# Patient Record
Sex: Female | Born: 1940 | Race: White | Hispanic: No | Marital: Married | Smoking: Former smoker
Health system: Southern US, Community
[De-identification: ages and names within clinical notes are randomized; demographics above are authoritative.]

## PROBLEM LIST (undated history)

## (undated) DIAGNOSIS — F32A Depression, unspecified: Secondary | ICD-10-CM

## (undated) DIAGNOSIS — M436 Torticollis: Secondary | ICD-10-CM

## (undated) DIAGNOSIS — J309 Allergic rhinitis, unspecified: Secondary | ICD-10-CM

## (undated) DIAGNOSIS — E559 Vitamin D deficiency, unspecified: Secondary | ICD-10-CM

## (undated) DIAGNOSIS — E039 Hypothyroidism, unspecified: Secondary | ICD-10-CM

## (undated) DIAGNOSIS — G8929 Other chronic pain: Secondary | ICD-10-CM

## (undated) DIAGNOSIS — K219 Gastro-esophageal reflux disease without esophagitis: Secondary | ICD-10-CM

## (undated) DIAGNOSIS — F329 Major depressive disorder, single episode, unspecified: Secondary | ICD-10-CM

## (undated) HISTORY — DX: Major depressive disorder, single episode, unspecified: F32.9

## (undated) HISTORY — DX: Gastro-esophageal reflux disease without esophagitis: K21.9

## (undated) HISTORY — DX: Other chronic pain: G89.29

## (undated) HISTORY — DX: Hypothyroidism, unspecified: E03.9

## (undated) HISTORY — DX: Depression, unspecified: F32.A

## (undated) HISTORY — PX: OTHER SURGICAL HISTORY: SHX169

## (undated) HISTORY — DX: Allergic rhinitis, unspecified: J30.9

## (undated) HISTORY — DX: Vitamin D deficiency, unspecified: E55.9

## (undated) HISTORY — DX: Torticollis: M43.6

## (undated) HISTORY — PX: LAPAROSCOPIC TUBAL LIGATION: SHX1937

## (undated) HISTORY — PX: SHOULDER OPEN ROTATOR CUFF REPAIR: SHX2407

## (undated) HISTORY — PX: BREAST ENHANCEMENT SURGERY: SHX7

## (undated) HISTORY — PX: NECK SURGERY: SHX720

## (undated) HISTORY — PX: AUGMENTATION MAMMAPLASTY: SUR837

---

## 1997-07-31 ENCOUNTER — Ambulatory Visit (HOSPITAL_COMMUNITY): Admission: RE | Admit: 1997-07-31 | Discharge: 1997-07-31 | Payer: Self-pay | Admitting: Obstetrics and Gynecology

## 1998-12-17 ENCOUNTER — Other Ambulatory Visit: Admission: RE | Admit: 1998-12-17 | Discharge: 1998-12-17 | Payer: Self-pay | Admitting: Obstetrics and Gynecology

## 1998-12-17 ENCOUNTER — Ambulatory Visit (HOSPITAL_COMMUNITY): Admission: RE | Admit: 1998-12-17 | Discharge: 1998-12-17 | Payer: Self-pay | Admitting: Obstetrics and Gynecology

## 1998-12-17 ENCOUNTER — Encounter: Payer: Self-pay | Admitting: Obstetrics and Gynecology

## 2000-07-08 ENCOUNTER — Encounter: Admission: RE | Admit: 2000-07-08 | Discharge: 2000-07-08 | Payer: Self-pay | Admitting: Internal Medicine

## 2000-07-08 ENCOUNTER — Encounter: Payer: Self-pay | Admitting: Internal Medicine

## 2000-07-12 ENCOUNTER — Encounter: Admission: RE | Admit: 2000-07-12 | Discharge: 2000-07-12 | Payer: Self-pay | Admitting: Internal Medicine

## 2000-07-12 ENCOUNTER — Encounter: Payer: Self-pay | Admitting: Internal Medicine

## 2000-08-24 ENCOUNTER — Encounter: Admission: RE | Admit: 2000-08-24 | Discharge: 2000-08-24 | Payer: Self-pay | Admitting: Obstetrics and Gynecology

## 2000-08-24 ENCOUNTER — Encounter: Payer: Self-pay | Admitting: Obstetrics and Gynecology

## 2000-08-25 ENCOUNTER — Encounter (INDEPENDENT_AMBULATORY_CARE_PROVIDER_SITE_OTHER): Payer: Self-pay

## 2000-08-25 ENCOUNTER — Other Ambulatory Visit: Admission: RE | Admit: 2000-08-25 | Discharge: 2000-08-25 | Payer: Self-pay | Admitting: Obstetrics and Gynecology

## 2002-01-03 ENCOUNTER — Ambulatory Visit (HOSPITAL_COMMUNITY): Admission: RE | Admit: 2002-01-03 | Discharge: 2002-01-03 | Payer: Self-pay | Admitting: Gastroenterology

## 2002-01-25 ENCOUNTER — Encounter: Payer: Self-pay | Admitting: Obstetrics and Gynecology

## 2002-01-25 ENCOUNTER — Ambulatory Visit (HOSPITAL_COMMUNITY): Admission: RE | Admit: 2002-01-25 | Discharge: 2002-01-25 | Payer: Self-pay | Admitting: Obstetrics and Gynecology

## 2002-10-25 ENCOUNTER — Encounter: Admission: RE | Admit: 2002-10-25 | Discharge: 2002-10-25 | Payer: Self-pay | Admitting: Internal Medicine

## 2002-10-25 ENCOUNTER — Encounter: Payer: Self-pay | Admitting: Internal Medicine

## 2003-04-24 ENCOUNTER — Encounter: Admission: RE | Admit: 2003-04-24 | Discharge: 2003-04-24 | Payer: Self-pay | Admitting: Internal Medicine

## 2003-11-05 ENCOUNTER — Other Ambulatory Visit: Admission: RE | Admit: 2003-11-05 | Discharge: 2003-11-05 | Payer: Self-pay | Admitting: Obstetrics and Gynecology

## 2003-11-21 ENCOUNTER — Encounter: Admission: RE | Admit: 2003-11-21 | Discharge: 2003-11-21 | Payer: Self-pay | Admitting: Internal Medicine

## 2003-12-04 ENCOUNTER — Encounter: Admission: RE | Admit: 2003-12-04 | Discharge: 2003-12-04 | Payer: Self-pay | Admitting: Internal Medicine

## 2004-05-02 ENCOUNTER — Encounter: Admission: RE | Admit: 2004-05-02 | Discharge: 2004-05-02 | Payer: Self-pay | Admitting: Internal Medicine

## 2004-12-01 ENCOUNTER — Other Ambulatory Visit: Admission: RE | Admit: 2004-12-01 | Discharge: 2004-12-01 | Payer: Self-pay | Admitting: Obstetrics and Gynecology

## 2004-12-23 ENCOUNTER — Encounter: Admission: RE | Admit: 2004-12-23 | Discharge: 2004-12-23 | Payer: Self-pay | Admitting: Obstetrics and Gynecology

## 2005-10-13 ENCOUNTER — Ambulatory Visit: Payer: Self-pay | Admitting: Anesthesiology

## 2005-11-08 ENCOUNTER — Encounter: Admission: RE | Admit: 2005-11-08 | Discharge: 2005-11-08 | Payer: Self-pay | Admitting: Internal Medicine

## 2005-11-10 ENCOUNTER — Encounter: Admission: RE | Admit: 2005-11-10 | Discharge: 2005-11-10 | Payer: Self-pay | Admitting: Internal Medicine

## 2005-12-03 ENCOUNTER — Other Ambulatory Visit: Admission: RE | Admit: 2005-12-03 | Discharge: 2005-12-03 | Payer: Self-pay | Admitting: Obstetrics and Gynecology

## 2005-12-06 ENCOUNTER — Ambulatory Visit: Payer: Self-pay | Admitting: Anesthesiology

## 2005-12-13 ENCOUNTER — Encounter: Admission: RE | Admit: 2005-12-13 | Discharge: 2005-12-13 | Payer: Self-pay | Admitting: Internal Medicine

## 2006-01-12 ENCOUNTER — Ambulatory Visit: Payer: Self-pay | Admitting: Anesthesiology

## 2006-02-21 ENCOUNTER — Encounter: Admission: RE | Admit: 2006-02-21 | Discharge: 2006-02-21 | Payer: Self-pay | Admitting: Internal Medicine

## 2006-05-10 ENCOUNTER — Encounter: Admission: RE | Admit: 2006-05-10 | Discharge: 2006-05-10 | Payer: Self-pay | Admitting: Internal Medicine

## 2006-09-14 ENCOUNTER — Encounter: Admission: RE | Admit: 2006-09-14 | Discharge: 2006-09-14 | Payer: Self-pay | Admitting: Obstetrics and Gynecology

## 2006-12-05 ENCOUNTER — Other Ambulatory Visit: Admission: RE | Admit: 2006-12-05 | Discharge: 2006-12-05 | Payer: Self-pay | Admitting: Obstetrics and Gynecology

## 2006-12-23 ENCOUNTER — Ambulatory Visit: Payer: Self-pay | Admitting: Neurosurgery

## 2007-01-13 ENCOUNTER — Ambulatory Visit: Payer: Self-pay | Admitting: Neurosurgery

## 2007-11-02 ENCOUNTER — Encounter: Admission: RE | Admit: 2007-11-02 | Discharge: 2007-11-02 | Payer: Self-pay | Admitting: Obstetrics and Gynecology

## 2007-12-12 ENCOUNTER — Other Ambulatory Visit: Admission: RE | Admit: 2007-12-12 | Discharge: 2007-12-12 | Payer: Self-pay | Admitting: Obstetrics and Gynecology

## 2007-12-18 ENCOUNTER — Encounter: Admission: RE | Admit: 2007-12-18 | Discharge: 2007-12-18 | Payer: Self-pay | Admitting: Obstetrics and Gynecology

## 2008-01-26 IMAGING — US US ABDOMEN COMPLETE
1 series · 14 of 25 positions shown · non-contrast
Comparison: 12/04/03.

CLINICAL DATA: Right upper quadrant pain.  
 COMPLETE ABDOMINAL ULTRASOUND:
TECHNIQUE: Complete abdominal ultrasound examination was performed including evaluation of the liver, gallbladder, bile ducts, pancreas, kidneys, spleen, IVC, and abdominal aorta.

[Series 1: unknown · 0.35mm/px · 14 of 93 slices shown]
[im 1/93]
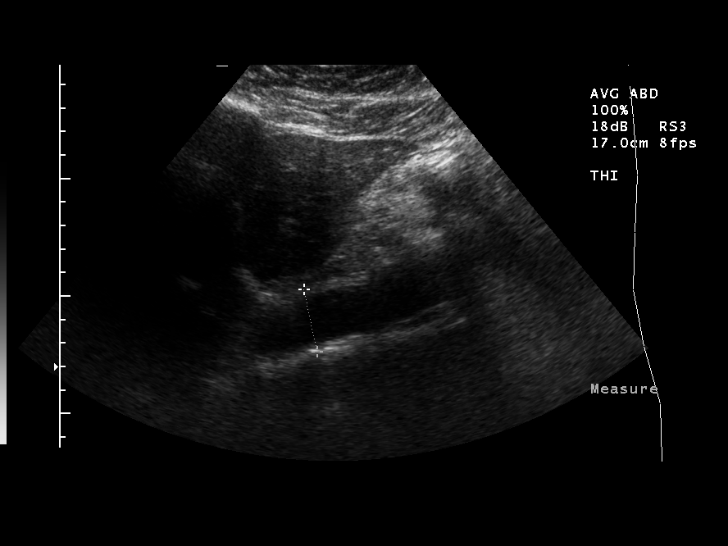
[im 8/93]
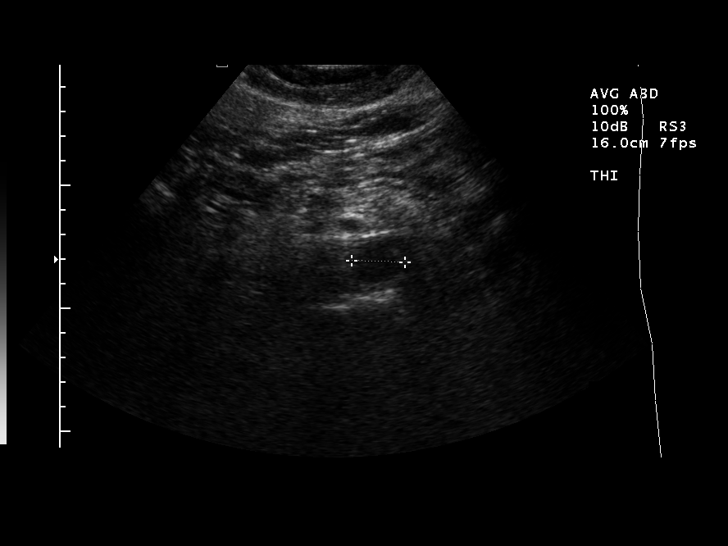
[im 16/93]
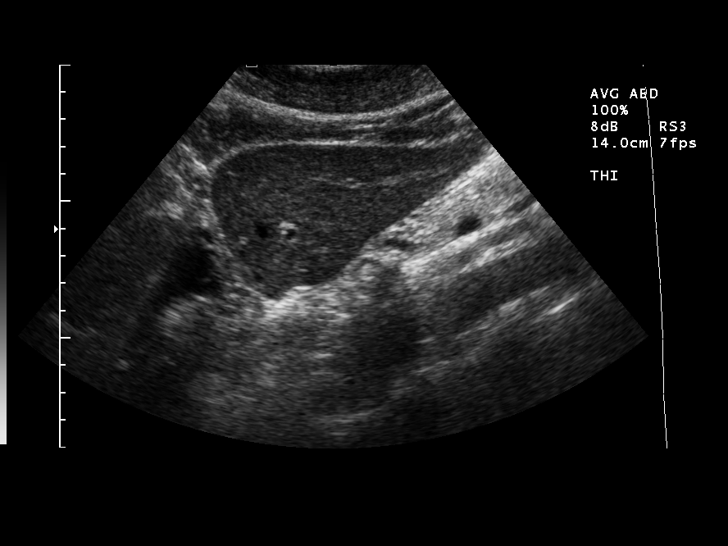
[im 24/93]
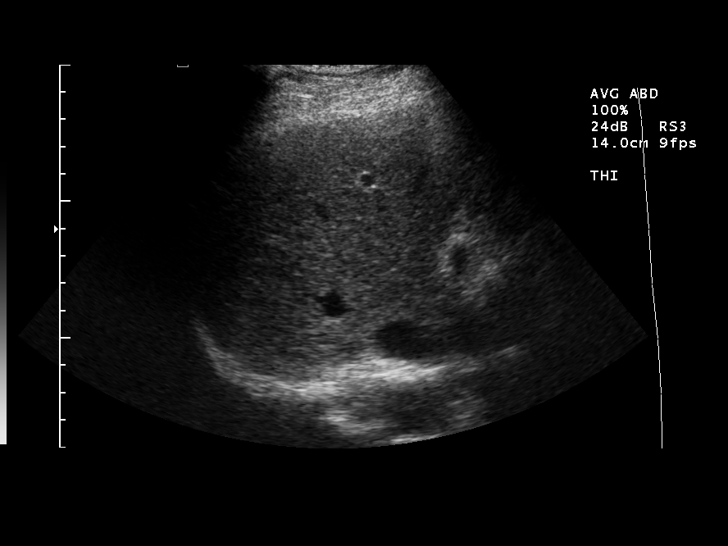
[im 31/93]
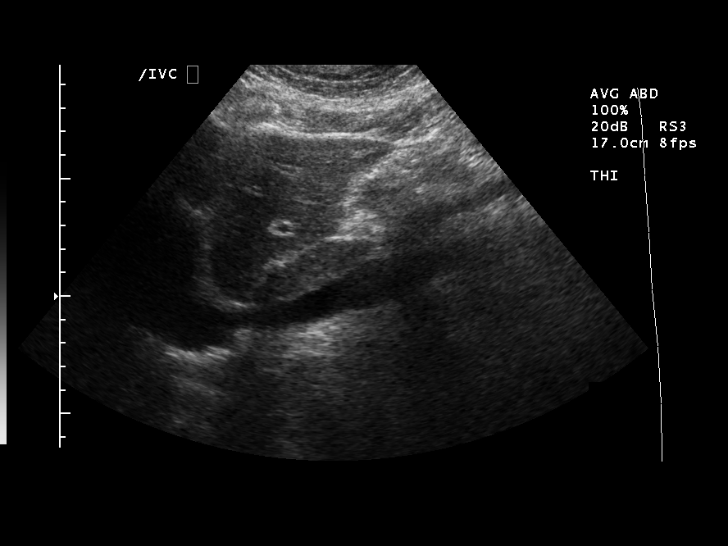
[im 35/93]
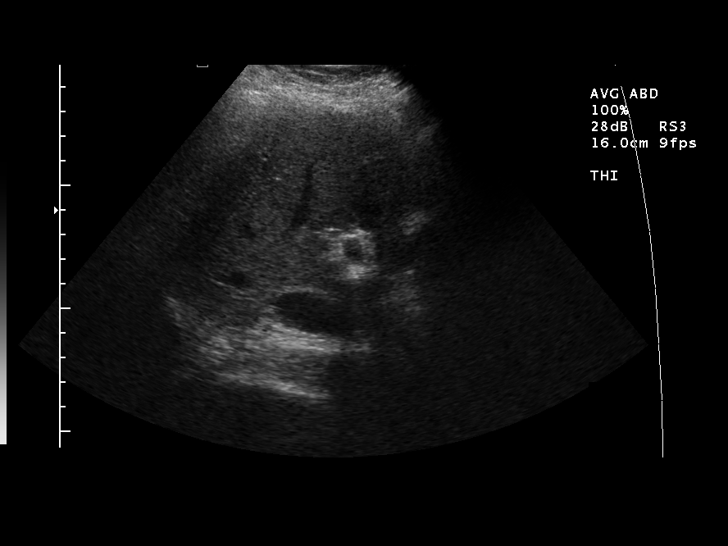
[im 43/93]
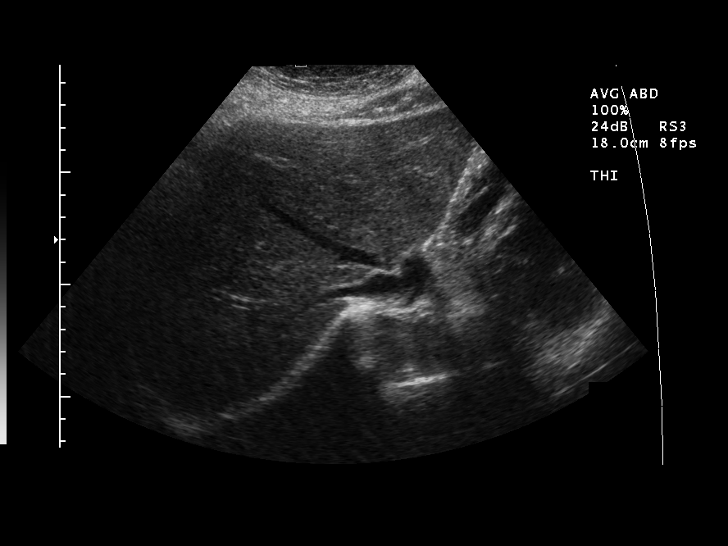
[im 50/93]
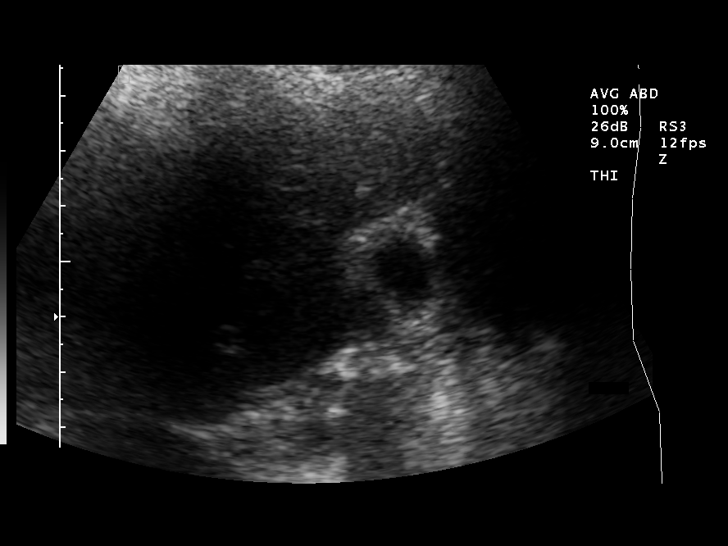
[im 58/93]
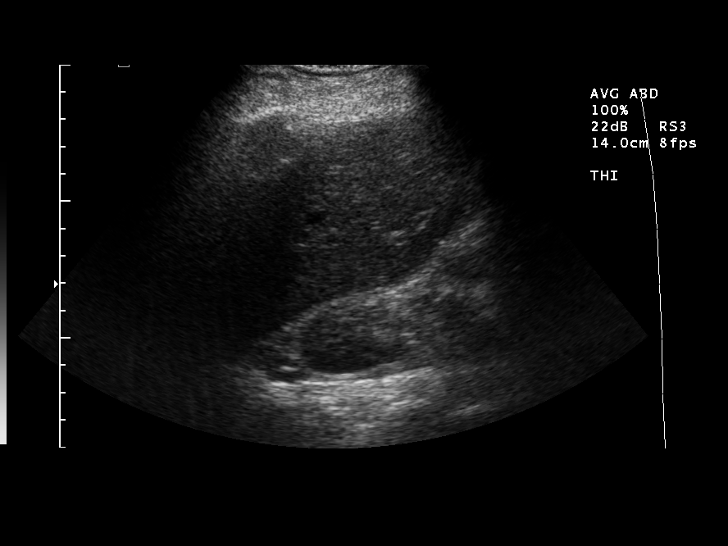
[im 62/93]
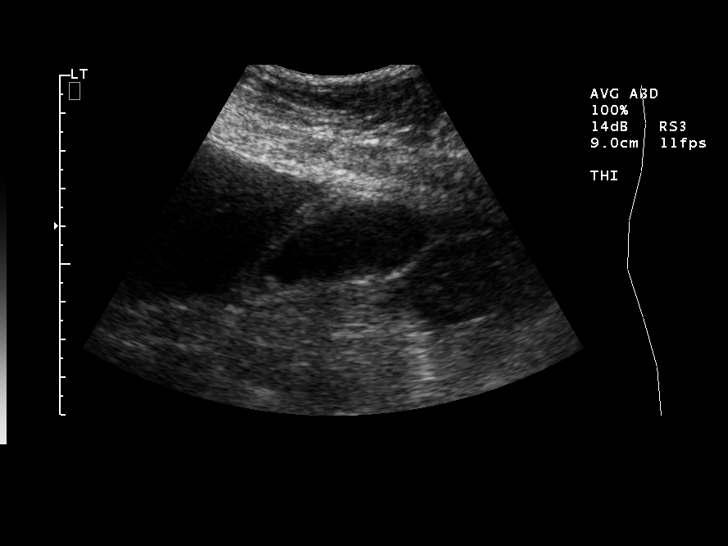
[im 70/93]
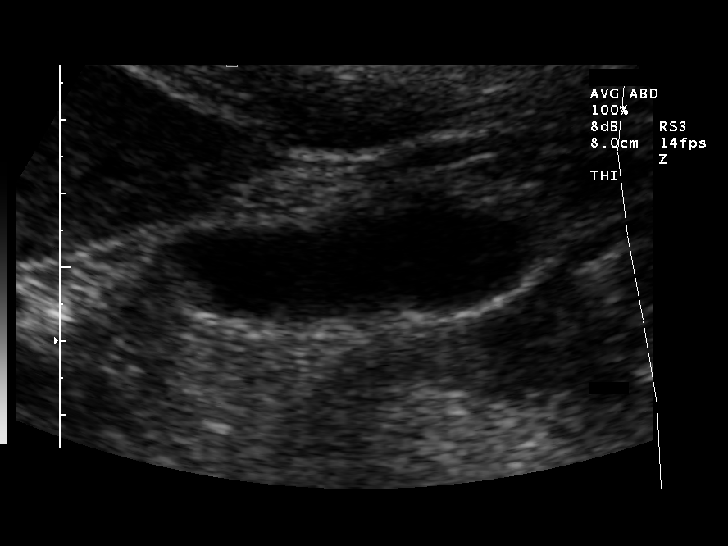
[im 77/93]
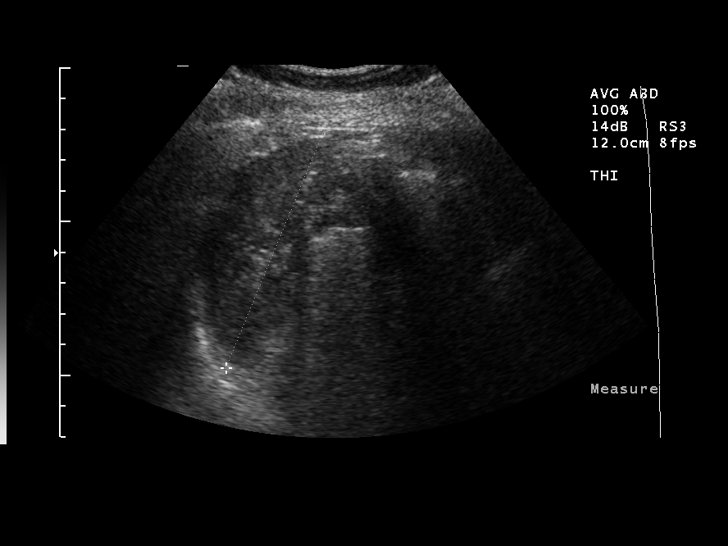
[im 85/93]
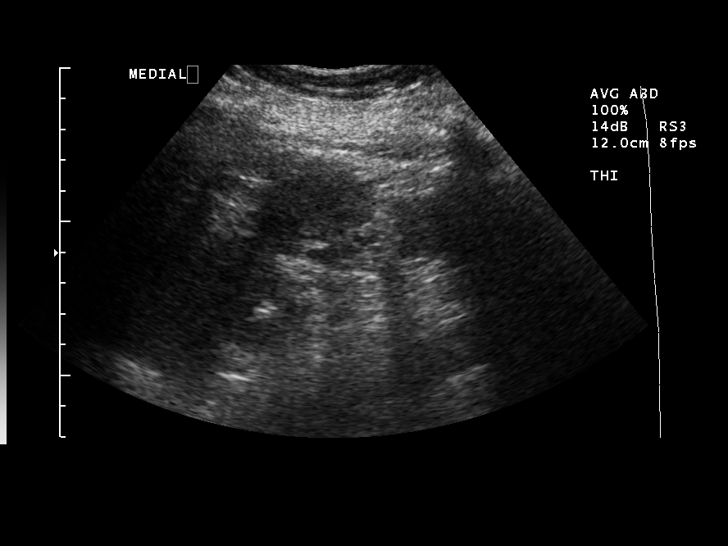
[im 93/93]
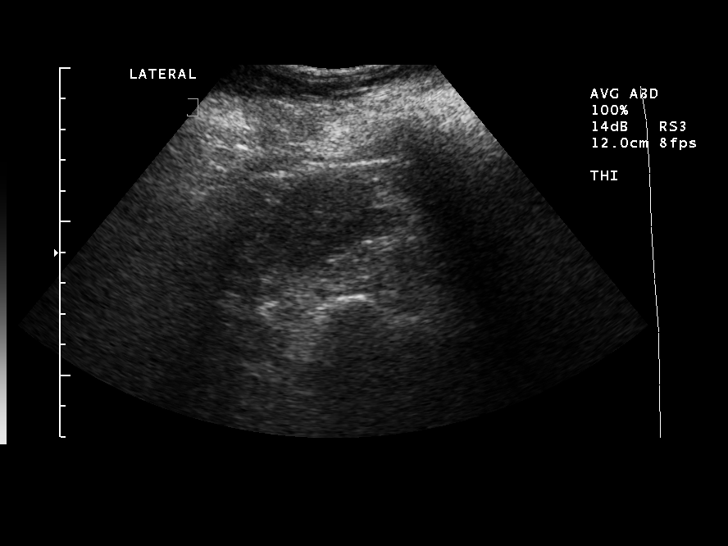

[14 of 25 positions shown; findings below may reference images not displayed]

FINDINGS: The aorta tapers normally.  The liver is unremarkable.  No extrahepatic biliary duct dilatation.  Gallbladder wall is slightly thickened and irregular measuring 5 to 8mm.  No definitive sonographic Murphy?s sign although patient reports tenderness during scanning.  IVC, pancreas, spleen, and kidneys are unremarkable.
IMPRESSION: Gallbladder wall thickening without internal debris or stones.  Please correlate clinically.

## 2009-01-23 ENCOUNTER — Other Ambulatory Visit: Admission: RE | Admit: 2009-01-23 | Discharge: 2009-01-23 | Payer: Self-pay | Admitting: Obstetrics and Gynecology

## 2009-02-17 ENCOUNTER — Ambulatory Visit: Payer: Self-pay | Admitting: Internal Medicine

## 2009-03-11 IMAGING — CR DG CHEST 2V
1 series · 2 of 2 positions shown · non-contrast
Comparison: none

REASON FOR EXAM: [DATE] Rib Pain
COMMENTS:

[Series 1: view not recorded · 0.17mm/px · 2 of 2 slices shown]
[im 1/2]
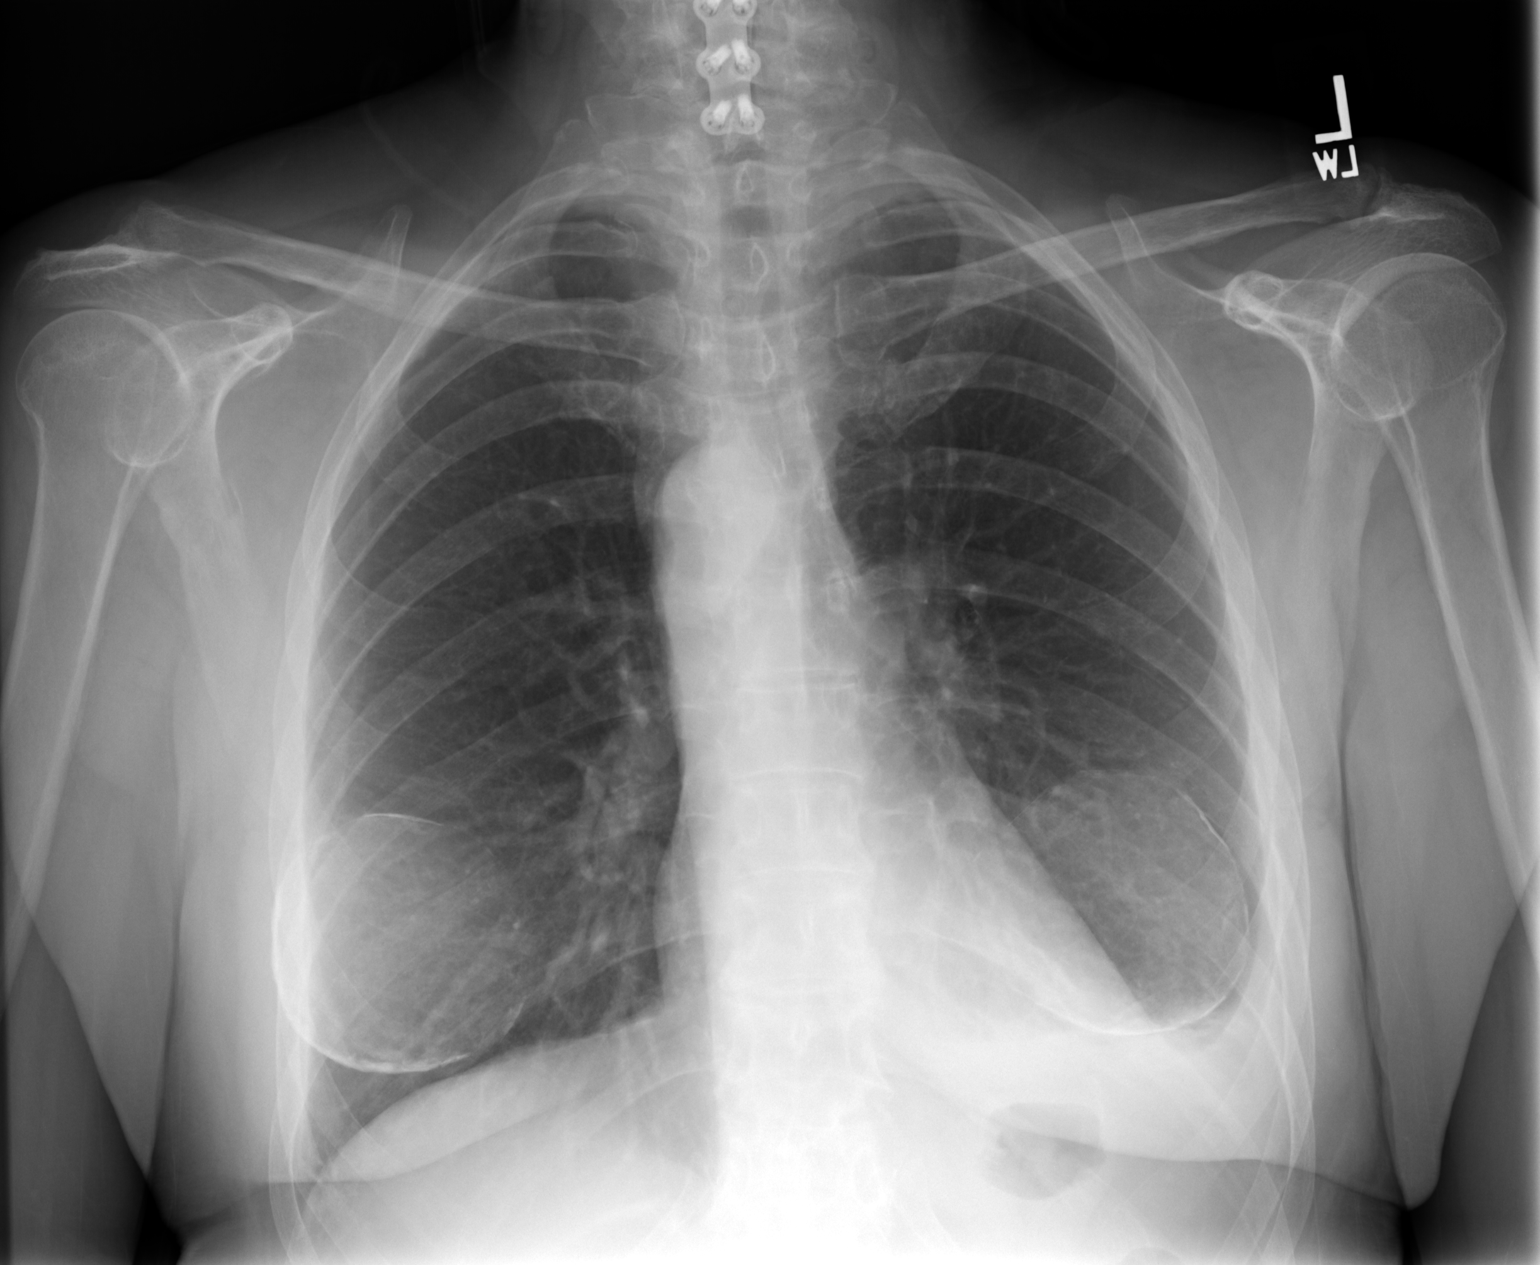
[im 2/2]
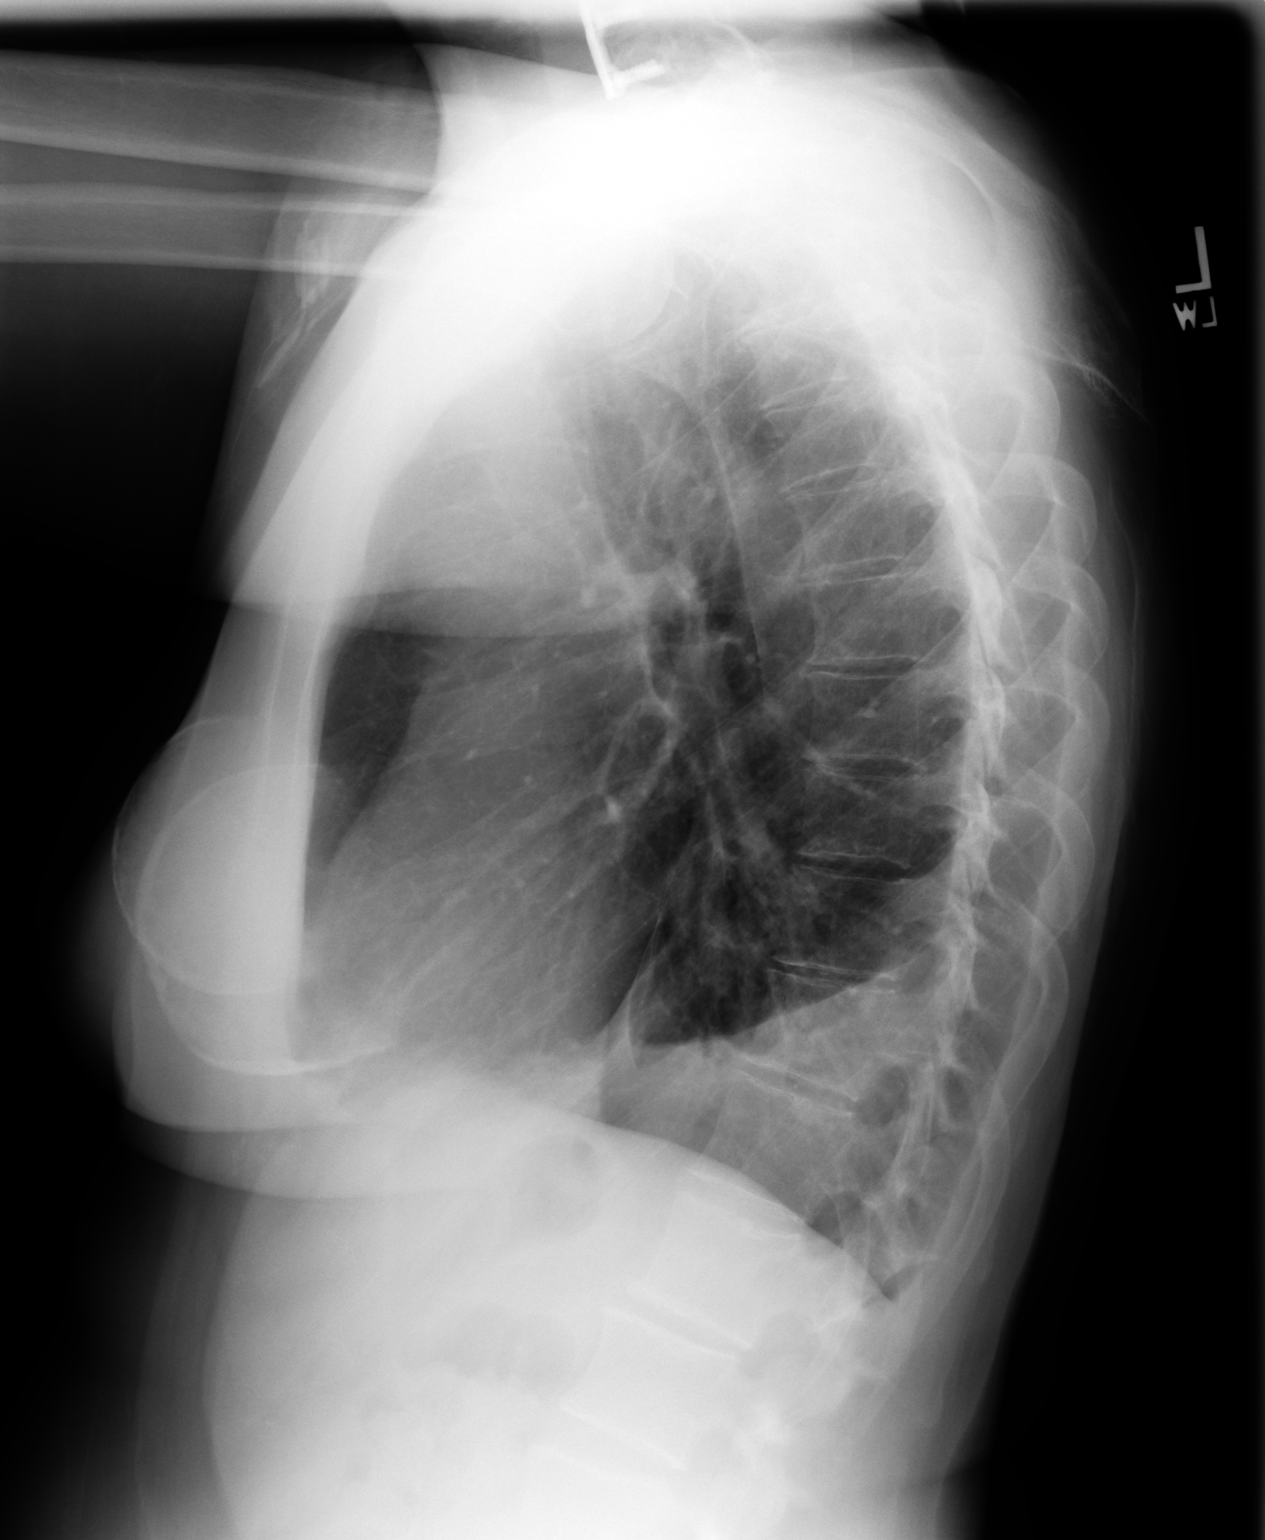

[2 of 2 positions shown; findings below may reference images not displayed]

PROCEDURE:     DXR - DXR CHEST PA (OR AP) AND LATERAL  - December 23, 2006 [DATE]

RESULT:     There is blunting of the LEFT costophrenic angle compatible with
a LEFT pleural effusion. The etiology for this effusion is not identified.
No pneumonia, pneumothorax or pleural effusion is seen. Heart size is
normal. The patient has a RIGHT-sided aortic knob. Postoperative changes of
the lower cervical spine are observed.
IMPRESSION: 1. There is a small LEFT pleural effusion.
2. The lung fields otherwise are clear.
3. There is a RIGHT-sided aortic knob.
4. The chest appears hyperexpanded suspicious for a history of COPD or
asthma.

## 2009-03-13 ENCOUNTER — Emergency Department: Payer: Self-pay | Admitting: Emergency Medicine

## 2009-03-17 ENCOUNTER — Ambulatory Visit: Payer: Self-pay | Admitting: Internal Medicine

## 2009-04-19 ENCOUNTER — Ambulatory Visit: Payer: Self-pay | Admitting: Internal Medicine

## 2009-04-22 ENCOUNTER — Ambulatory Visit: Payer: Self-pay | Admitting: Internal Medicine

## 2009-05-20 ENCOUNTER — Ambulatory Visit: Payer: Self-pay | Admitting: Internal Medicine

## 2009-05-28 ENCOUNTER — Encounter: Admission: RE | Admit: 2009-05-28 | Discharge: 2009-05-28 | Payer: Self-pay | Admitting: Obstetrics and Gynecology

## 2009-06-17 ENCOUNTER — Ambulatory Visit: Payer: Self-pay | Admitting: Internal Medicine

## 2009-07-18 ENCOUNTER — Ambulatory Visit: Payer: Self-pay | Admitting: Internal Medicine

## 2009-07-21 ENCOUNTER — Ambulatory Visit: Payer: Self-pay | Admitting: Internal Medicine

## 2009-07-28 ENCOUNTER — Ambulatory Visit: Payer: Self-pay | Admitting: Internal Medicine

## 2009-08-17 ENCOUNTER — Ambulatory Visit: Payer: Self-pay | Admitting: Internal Medicine

## 2009-11-17 ENCOUNTER — Ambulatory Visit: Payer: Self-pay | Admitting: Internal Medicine

## 2009-12-11 ENCOUNTER — Ambulatory Visit: Payer: Self-pay | Admitting: Internal Medicine

## 2009-12-18 ENCOUNTER — Ambulatory Visit: Payer: Self-pay | Admitting: Internal Medicine

## 2010-02-19 ENCOUNTER — Ambulatory Visit: Payer: Self-pay | Admitting: Internal Medicine

## 2010-03-19 ENCOUNTER — Ambulatory Visit: Payer: Self-pay | Admitting: Internal Medicine

## 2010-05-10 ENCOUNTER — Encounter: Payer: Self-pay | Admitting: Orthopaedic Surgery

## 2010-08-26 ENCOUNTER — Ambulatory Visit: Payer: Self-pay | Admitting: Internal Medicine

## 2010-09-18 ENCOUNTER — Ambulatory Visit: Payer: Self-pay | Admitting: Internal Medicine

## 2011-02-01 ENCOUNTER — Other Ambulatory Visit: Payer: Self-pay | Admitting: Obstetrics and Gynecology

## 2011-02-01 ENCOUNTER — Other Ambulatory Visit (HOSPITAL_COMMUNITY)
Admission: RE | Admit: 2011-02-01 | Discharge: 2011-02-01 | Disposition: A | Discharge status: Home / Self Care | Payer: Medicare Other | Source: Ambulatory Visit | Attending: Obstetrics and Gynecology | Admitting: Obstetrics and Gynecology

## 2011-02-01 DIAGNOSIS — Z124 Encounter for screening for malignant neoplasm of cervix: Secondary | ICD-10-CM | POA: Insufficient documentation

## 2011-02-01 DIAGNOSIS — Z113 Encounter for screening for infections with a predominantly sexual mode of transmission: Secondary | ICD-10-CM | POA: Insufficient documentation

## 2011-02-01 DIAGNOSIS — Z1159 Encounter for screening for other viral diseases: Secondary | ICD-10-CM | POA: Insufficient documentation

## 2011-02-02 ENCOUNTER — Other Ambulatory Visit: Payer: Self-pay | Admitting: Plastic Surgery

## 2011-02-02 DIAGNOSIS — Z1231 Encounter for screening mammogram for malignant neoplasm of breast: Secondary | ICD-10-CM

## 2011-02-19 ENCOUNTER — Ambulatory Visit
Admission: RE | Admit: 2011-02-19 | Discharge: 2011-02-19 | Disposition: A | Discharge status: Home / Self Care | Payer: Medicare Other | Source: Ambulatory Visit | Attending: Plastic Surgery | Admitting: Plastic Surgery

## 2011-02-19 DIAGNOSIS — Z1231 Encounter for screening mammogram for malignant neoplasm of breast: Secondary | ICD-10-CM

## 2011-02-22 ENCOUNTER — Other Ambulatory Visit: Payer: Self-pay | Admitting: Obstetrics and Gynecology

## 2011-02-22 DIAGNOSIS — R1031 Right lower quadrant pain: Secondary | ICD-10-CM

## 2011-02-26 ENCOUNTER — Other Ambulatory Visit: Payer: Medicare Other

## 2011-03-01 ENCOUNTER — Ambulatory Visit: Payer: Self-pay | Admitting: Internal Medicine

## 2011-03-02 ENCOUNTER — Ambulatory Visit
Admission: RE | Admit: 2011-03-02 | Discharge: 2011-03-02 | Disposition: A | Discharge status: Home / Self Care | Payer: Medicare Other | Source: Ambulatory Visit | Attending: Obstetrics and Gynecology | Admitting: Obstetrics and Gynecology

## 2011-03-02 DIAGNOSIS — R1031 Right lower quadrant pain: Secondary | ICD-10-CM

## 2011-03-02 MED ORDER — IOHEXOL 300 MG/ML  SOLN
100.0000 mL | Freq: Once | INTRAMUSCULAR | Status: AC | PRN
  Administered 2011-03-02: 100 mL via INTRAVENOUS

## 2011-03-20 ENCOUNTER — Ambulatory Visit: Payer: Self-pay | Admitting: Internal Medicine

## 2011-04-20 ENCOUNTER — Ambulatory Visit: Payer: Self-pay | Admitting: Internal Medicine

## 2011-05-21 ENCOUNTER — Ambulatory Visit: Payer: Self-pay | Admitting: Internal Medicine

## 2011-09-23 ENCOUNTER — Other Ambulatory Visit: Payer: Self-pay | Admitting: Plastic Surgery

## 2012-03-08 ENCOUNTER — Other Ambulatory Visit: Payer: Self-pay | Admitting: Internal Medicine

## 2012-03-10 ENCOUNTER — Other Ambulatory Visit: Payer: Self-pay | Admitting: Plastic Surgery

## 2012-03-10 DIAGNOSIS — N644 Mastodynia: Secondary | ICD-10-CM

## 2012-03-20 ENCOUNTER — Other Ambulatory Visit: Payer: Self-pay | Admitting: Plastic Surgery

## 2012-03-20 ENCOUNTER — Ambulatory Visit
Admission: RE | Admit: 2012-03-20 | Discharge: 2012-03-20 | Disposition: A | Discharge status: Home / Self Care | Payer: Medicare Other | Source: Ambulatory Visit | Attending: Plastic Surgery | Admitting: Plastic Surgery

## 2012-03-20 DIAGNOSIS — N644 Mastodynia: Secondary | ICD-10-CM

## 2013-01-05 ENCOUNTER — Ambulatory Visit: Payer: Self-pay | Admitting: Otolaryngology

## 2013-02-22 ENCOUNTER — Other Ambulatory Visit: Payer: Self-pay

## 2013-02-22 DIAGNOSIS — Z1231 Encounter for screening mammogram for malignant neoplasm of breast: Secondary | ICD-10-CM

## 2013-03-26 ENCOUNTER — Ambulatory Visit
Admission: RE | Admit: 2013-03-26 | Discharge: 2013-03-26 | Disposition: A | Discharge status: Home / Self Care | Payer: Medicare Other | Source: Ambulatory Visit

## 2013-03-26 DIAGNOSIS — Z1231 Encounter for screening mammogram for malignant neoplasm of breast: Secondary | ICD-10-CM

## 2013-04-25 ENCOUNTER — Encounter (INDEPENDENT_AMBULATORY_CARE_PROVIDER_SITE_OTHER): Payer: Self-pay

## 2013-04-25 ENCOUNTER — Encounter: Payer: Self-pay | Admitting: Neurology

## 2013-04-25 ENCOUNTER — Ambulatory Visit (INDEPENDENT_AMBULATORY_CARE_PROVIDER_SITE_OTHER): Payer: Medicare Other | Admitting: Neurology

## 2013-04-25 VITALS — BP 135/83 | HR 96 | Ht 69.0 in | Wt 190.0 lb

## 2013-04-25 DIAGNOSIS — M436 Torticollis: Secondary | ICD-10-CM

## 2013-04-25 DIAGNOSIS — G8929 Other chronic pain: Secondary | ICD-10-CM | POA: Insufficient documentation

## 2013-04-25 DIAGNOSIS — K219 Gastro-esophageal reflux disease without esophagitis: Secondary | ICD-10-CM

## 2013-04-25 DIAGNOSIS — F329 Major depressive disorder, single episode, unspecified: Secondary | ICD-10-CM | POA: Insufficient documentation

## 2013-04-25 DIAGNOSIS — J309 Allergic rhinitis, unspecified: Secondary | ICD-10-CM

## 2013-04-25 DIAGNOSIS — E039 Hypothyroidism, unspecified: Secondary | ICD-10-CM

## 2013-04-25 DIAGNOSIS — E559 Vitamin D deficiency, unspecified: Secondary | ICD-10-CM | POA: Insufficient documentation

## 2013-04-25 DIAGNOSIS — F32A Depression, unspecified: Secondary | ICD-10-CM | POA: Insufficient documentation

## 2013-04-25 NOTE — Progress Notes (Signed)
[facility]  PATIENT: Julie Shelton DOB: 11/21/1940  HISTORICAL  Julie Shelton is a 73 years old right-handed Caucasian female, by herself, referred by her primary care physician Dr. 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  She had a past medical history of hypothyroidism, on supplement she was diagnosed with cervical dystonia around 2008 at [facility]  Around 2004, wiithout clear trigger, she had gradual onset of head pulling to her left shoulder, difficulty turning her neck to either direction especially to her left, later she also developed shooting pain from her left cervical region down to her left shoulder, numbness involving left fifth finger  She had MRI of cervical in 2006 at [facility] imaging, which showed loss of height with left eccentric disc osteophyte complex at C5-6, and C6 and 7, with mild central canal narrowing, minimum left foraminal narrowing at C6 and 7, repeat MRI of cervical in 2007, a year later, continued to demonstrate spondylosis at C5-6, C6 and 7, with endplate osteophyte, covering bulging disc material, mild canal stenosis, no cord compression, there is no significant foraminal stenosis.  However with her persistent left-sided neck pain, shooting pain to her left shoulder, she underwent anterior cervical discectomy, and fusion at C5-6, C6 and 7 with tricortical autograft, and plating with a short statue synthetic system C5-C7 by Dr.Roy, Ranjan at [facility] in August 2008, this was done at [place], [place],  She only has minimum improvement of her neck pain postsurgically, eventually she was evaluated by [facility] in October 2008, was correctly diagnosed with spasmodic torticollis, has received repeat EMG guided Botox injection under supervision of Dr. Jeremy Johann, she was receiving injections every 3-5 months, last injection was July 19 2012, she reported mild to moderate improvement with each injection, due to insurance  change, she could no longer go to [facility] for continued treatment. She desires to transfer her injection locally.  She denied any significant side effect was Botox injection, was not sure about the dosage, but reported continued mild to moderate improvement with each injection, over the past few months, because prolonged interval with the last injection, she noticed worsening neck pain, difficulty turning her neck, and the worsening abnormal posture.  She is also taking clonazepam 0.5 mg twice a day, she has significant neck pain, 8 out of 10, which has put limitation on her daily activity,  She denies gait difficulty, no bilateral arm persistent sensory loss, all weakness,   REVIEW OF SYSTEMS: Full 14 system review of systems performed and notable only for neck pain, achy muscles, headache, slurred speech, mild difficulty swallowing due to abnormal neck posture, depression, disinterested in activities , ALLERGIES: No Known Allergies  HOME MEDICATIONS:  Clonazepam 0 point 5 mg twice a day Levothyroxine,  PAST MEDICAL HISTORY: Past Medical History  Diagnosis Date  . Torticollis   . Depression   . GERD (gastroesophageal reflux disease)   . Hypothyroidism   . Allergic rhinitis   . Vitamin D deficiency     PAST SURGICAL HISTORY: Past Surgical History  Procedure Laterality Date  . Shoulder open rotator cuff repair left 2003  . Breast enhancement surgery  1970s.  . Laparoscopic tubal ligation    . Breast implants removed  2014  . Neck surgery,  Left cervical radiculopathy Dr. Channing Mutters  2008    FAMILY HISTORY: Family History  Problem Relation Age of Onset  . Dementia Father     SOCIAL HISTORY:  History   Social History  . Marital  Status: Married    Spouse Name: Julie Shelton    Number of Children: 2  . Years of Education: 12   Occupational History    retired   Social History Main Topics  . Smoking status: Former Smoker    Types: Cigarettes  . Smokeless tobacco: Never Used      Comment: Quit in 1972  . Alcohol Use: 1.8 oz/week    3 Cans of beer per week     Comment: three at night  . Drug Use: No  . Sexual Activity: Not on file   Other Topics Concern  . Not on file   Social History Narrative   Patient lives at home with her husband Julie Russell([name]) .   Patient is retired.    Education- High school    Caffeine- two or three cups daily.     PHYSICAL EXAM   Filed Vitals:   04/25/13 1251  BP: 135/83  Pulse: 96  Height: 5\' 9"  (1.753 m)  Weight: 190 lb (86.183 kg)     Body mass index is 28.05 kg/(m^2).   Generalized: In no acute distress  Neck: Supple, no carotid bruits   Cardiac: Regular rate rhythm  Pulmonary: Clear to auscultation bilaterally  Musculoskeletal: No deformity  Neurological examination  Mentation: Alert oriented to time, place, history taking, and causual conversation, she has constant moderate retrocollis, moderate left turning, mild right tilt, mild to moderate right shoulder elevation,  Cranial nerve II-XII: Pupils were equal round reactive to light extraocular movements were full, Visual field were full on confrontational test. Bilateral fundi were sharp.  Facial sensation and strength were normal. Hearing was intact to finger rubbing bilaterally. Uvula tongue midline.  head turning and shoulder shrug and were normal and symmetric.Tongue protrusion into cheek strength was normal.  Motor: normal tone, bulk and strength.  Sensory: Intact to fine touch, pinprick, preserved vibratory sensation, and proprioception at toes.  Coordination: Normal finger to nose, heel-to-shin bilaterally there was no truncal ataxia  Gait: Rising up from seated position without assistance, normal stance, without trunk ataxia, moderate stride, good arm swing, smooth turning, able to perform tiptoe, and heel walking without difficulty.   Romberg signs: Negative  Deep tendon reflexes: Brachioradialis 2/2, biceps 2/2, triceps 2/2, patellar 2/2, Achilles  2/2, plantar responses were flexor bilaterally.   DIAGNOSTIC DATA (LABS, IMAGING, TESTING) - I reviewed patient records, labs, notes, testing and imaging myself where available.  ASSESSMENT AND PLAN   73 years old Caucasian female, with a long-standing history of cervical dystonia, responding mild to moderately to previous Botox A injection,   1. Continue botulinum toxin injection, will try xeomin. 2. RTC in one for EMG guided injection  Levert FeinsteinYijun Danyel Griess, M.D. Ph.D.  [facility] [address], [address] [place], [place] [address] [phone]

## 2013-05-30 ENCOUNTER — Encounter (INDEPENDENT_AMBULATORY_CARE_PROVIDER_SITE_OTHER): Payer: Self-pay

## 2013-05-30 ENCOUNTER — Ambulatory Visit (INDEPENDENT_AMBULATORY_CARE_PROVIDER_SITE_OTHER): Payer: Medicare Other | Admitting: Neurology

## 2013-05-30 ENCOUNTER — Encounter: Payer: Self-pay | Admitting: Neurology

## 2013-05-30 VITALS — BP 135/75 | HR 83 | Ht 66.0 in | Wt 189.0 lb

## 2013-05-30 DIAGNOSIS — G8929 Other chronic pain: Secondary | ICD-10-CM

## 2013-05-30 DIAGNOSIS — G243 Spasmodic torticollis: Secondary | ICD-10-CM

## 2013-05-30 DIAGNOSIS — M436 Torticollis: Secondary | ICD-10-CM

## 2013-05-30 MED ORDER — INCOBOTULINUMTOXINA 100 UNITS IM SOLR
200.0000 [IU] | Freq: Once | INTRAMUSCULAR | Status: AC
  Administered 2013-05-30: 200 [IU] via INTRAMUSCULAR

## 2013-05-30 NOTE — Progress Notes (Signed)
[facility]  PATIENT: Frederick Peersdna R Isaza DOB: 1940/12/25  HISTORICAL  Marily Memosdna is a 73 years old right-handed Caucasian female, by herself, referred by her primary care physician Dr. 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  She had a past medical history of hypothyroidism, on supplement,  she was [facility] with [facility] around [facility]  Around 2004, wiithout clear trigger, she had gradual onset of head pulling to her left shoulder, difficulty turning her neck to either direction especially to her left, later she also developed shooting pain from her left cervical region down to her left shoulder, numbness involving left fifth finger  She [facility], which showed loss of height with left eccentric disc osteophyte complex at C5-6, and C6 and 7, with mild central canal narrowing, minimum left foraminal narrowing at C6 and 7, repeat MRI of cervical in 2007, a year later, continued to demonstrate spondylosis at C5-6, C6 and 7, with endplate osteophyte, covering bulging disc material, mild canal stenosis, no cord compression, there is no significant foraminal stenosis.  However with her persistent left-sided neck pain, shooting pain to her left shoulder, she underwent anterior cervical discectomy, and fusion at C5-6, C6 and 7 with tricortical autograft, and plating with a short statue synthetic system C5-C7 by Dr.Roy, Ranjan at [facility] in August 2008, this was done [place],  She only has minimum improvement of her neck pain postsurgically, eventually she was evaluated by [facility] in October 2008, was correctly diagnosed with spasmodic torticollis, has received repeat EMG guided Botox injection under supervision of Dr. Jeremy JohannJanice Massey, she was receiving injections every 3-5 months, last injection was April 2,2014, she reported mild to moderate improvement with each injection, due to  insurance change, she [facility] no [facility]. She desires to transfer her injection locally.  She denied any significant side effect was Botox injection, was not sure about the dosage, but reported continued mild to moderate improvement with each injection, over the past few months, because prolonged interval with the last injection, she noticed worsening neck pain, difficulty turning her neck, and the worsening abnormal posture.  She is also taking clonazepam 0.5 mg twice a day, she has significant neck pain, 8 out of 10, which has put limitation on her daily activity,  She denies gait difficulty, no bilateral arm persistent sensory loss, all weakness,   UPDATE Feb 11th 2015: She came in for EMG guided xeomin injections for cervical dystonia, this is the first time she used Xeomin she continued to complains of neck hyperextension, cracking sound when moving her neck, gait difficulty,    REVIEW OF SYSTEMS: Full 14 system review of systems performed and notable only for neck pain, achy muscles, headache, slurred speech, mild difficulty swallowing due to abnormal neck posture, depression, disinterested in activities , ALLERGIES: No Known Allergies  HOME MEDICATIONS:  Clonazepam 0 point 5 mg twice a day Levothyroxine,  PAST MEDICAL HISTORY: Past Medical History  Diagnosis Date  . Torticollis   . Depression   . GERD (gastroesophageal reflux disease)   . Hypothyroidism   . Allergic rhinitis   . Vitamin D deficiency     PAST SURGICAL HISTORY: Past Surgical History  Procedure Laterality Date  . Shoulder open rotator cuff repair left 2003  . Breast enhancement surgery  1970s.  . Laparoscopic tubal ligation    . Breast implants removed  2014  . Neck  surgery,  Left cervical radiculopathy Dr. Channing Mutters  2008    FAMILY HISTORY: Family History  Problem Relation Age of Onset  . Dementia Father   . Thyroid disease Mother     SOCIAL HISTORY:  History   Social  History  . Marital Status: Married    Spouse Name: Dorma Russell    Number of Children: 2  . Years of Education: 12   Occupational History    retired   Social History Main Topics  . Smoking status: Former Smoker    Types: Cigarettes  . Smokeless tobacco: Never Used     Comment: Quit in 1972  . Alcohol Use: 1.8 oz/week    3 Cans of beer per week     Comment: three at night  . Drug Use: No  . Sexual Activity: Not on file   Other Topics Concern  . Not on file   Social History Narrative   Patient lives at home with her husband Dorma Russell) .   Patient is retired.    Education- High school    Caffeine- two or three cups daily.     PHYSICAL EXAM   Filed Vitals:   05/30/13 1421  BP: 135/75  Pulse: 83  Height: 5\' 6"  (1.676 m)  Weight: 189 lb (85.73 kg)     Body mass index is 30.52 kg/(m^2).   Generalized: In no acute distress  Neck: Supple, no carotid bruits   Cardiac: Regular rate rhythm  Pulmonary: Clear to auscultation bilaterally  Musculoskeletal: No deformity  Neurological examination  Mentation: Alert oriented to time, place, history taking, and causual conversation, she has constant moderate to severe  retrocollis, moderate left turning, mild right tilt, mild to moderate right shoulder elevation,  Cranial nerve II-XII: Pupils were equal round reactive to light extraocular movements were full, Visual field were full on confrontational test. Bilateral fundi were sharp.  Facial sensation and strength were normal. Hearing was intact to finger rubbing bilaterally. Uvula tongue midline.  head turning and shoulder shrug and were normal and symmetric.Tongue protrusion into cheek strength was normal.  Motor: normal tone, bulk and strength.  Sensory: Intact to fine touch, pinprick, preserved vibratory sensation, and proprioception at toes.  Coordination: Normal finger to nose, heel-to-shin bilaterally there was no truncal ataxia  Gait: Rising up from seated position without  assistance, normal stance, without trunk ataxia, moderate stride, good arm swing, smooth turning, able to perform tiptoe, and heel walking without difficulty.   Romberg signs: Negative  Deep tendon reflexes: Brachioradialis 2/2, biceps 2/2, triceps 2/2, patellar 2/2, Achilles 2/2, plantar responses were flexor bilaterally.   DIAGNOSTIC DATA (LABS, IMAGING, TESTING) - I reviewed patient records, labs, notes, testing and imaging myself where available.  ASSESSMENT AND PLAN   73 years old Caucasian female, with a long-standing history of cervical dystonia, responding mild to moderately to previous Botox A injection,   EMG guided  xeomin injections  (100 units /2 cc normal saline , Lot November 4 8 9 4 7  8, Exp Jan 2017), 200 units was used.  Right oblique capitis inferior , 25 units Left oblique capitis inferior 25 units   Right longissimus capitis  25 units   Right semispinalis capitis 25 units Left semispinalis capitis 25 units  Right splenius capitis 25 units Left splenius capitis 25 units x2= 50 units  We will also proceed with MRI of cervical spine to rule out cervical radiculopathy, myelopathy, I will call her report, return to clinic in 3 months for repeat injection  Marcial Pacas, M.D. Ph.D.  [facility] [address] [place], [place] [address] [phone]

## 2013-05-31 DIAGNOSIS — M542 Cervicalgia: Secondary | ICD-10-CM | POA: Insufficient documentation

## 2013-06-01 ENCOUNTER — Telehealth: Payer: Self-pay | Admitting: Neurology

## 2013-06-01 NOTE — Telephone Encounter (Signed)
Patient is requesting meds for MRI scheduled on 02/25.  She would like something stronger prescribed than what she has previously been given, asking that it be called into [facility].  I called the patient to ask if she knew what was previously prescribed, and she said she does not know what med it was or where she got it filled.  The patient indicates she currently takes Klonopin 0.5mg  twice daily.  I will be happy to call in an Rx, with your approval.  Please advise.  Thank you.

## 2013-06-01 NOTE — Telephone Encounter (Signed)
Pt called and stated that she has her MRI scheduled for 2-25 @ 2:30 pm.  She is asking to be prescribed something to help her as she is severely claustrophobic.  She said that the last time it took 3 tries for her to be able to go into the machine. She said she may need something stronger than she received last time.  She asked if it would be possible for the medication to be went to the [facility] by tomorrow, but will understand if it isn't.  Please call if necessary.  Thank you

## 2013-06-04 MED ORDER — DIAZEPAM 5 MG PO TABS: 5.0000 mg | ORAL_TABLET | Freq: Four times a day (QID) | ORAL | Status: DC | PRN

## 2013-06-04 NOTE — Telephone Encounter (Signed)
I called the patient.  Relayed drug info and instructions.  Patient verbalized understanding.

## 2013-06-04 NOTE — Telephone Encounter (Signed)
Jessica: Please call patient I have refilled Valium 5 mg tablets, 5 tablets, one to 2 tablets 30 minutes before the MRI, may repeat twice. She does need people to drive her for MRI.

## 2013-06-13 ENCOUNTER — Ambulatory Visit
Admission: RE | Admit: 2013-06-13 | Discharge: 2013-06-13 | Disposition: A | Discharge status: Home / Self Care | Payer: Medicare Other | Source: Ambulatory Visit | Attending: Neurology | Admitting: Neurology

## 2013-06-13 DIAGNOSIS — M436 Torticollis: Secondary | ICD-10-CM

## 2013-06-13 DIAGNOSIS — G8929 Other chronic pain: Secondary | ICD-10-CM

## 2013-06-15 NOTE — Progress Notes (Signed)
Quick Note:  Julie LeashDonna: Please call patient, MRI of the cervical showed multilevel degenerative disc disease, but there was no significant canal stenosis ______

## 2013-06-20 NOTE — Progress Notes (Signed)
Quick Note:  Spoke to patient's husband and relayed MRI cervical results, per Dr. Terrace ArabiaYan. Told him to have her call if she had any further questions. ______

## 2013-06-21 ENCOUNTER — Telehealth: Payer: Self-pay | Admitting: Neurology

## 2013-06-21 NOTE — Telephone Encounter (Signed)
Patient would like a call back with results of recent MRI.

## 2013-06-22 NOTE — Telephone Encounter (Signed)
Spoke to patient and relayed MRI results, per Dr. Yan. 

## 2013-08-29 ENCOUNTER — Ambulatory Visit (INDEPENDENT_AMBULATORY_CARE_PROVIDER_SITE_OTHER): Payer: Medicare Other | Admitting: Neurology

## 2013-08-29 ENCOUNTER — Encounter (INDEPENDENT_AMBULATORY_CARE_PROVIDER_SITE_OTHER): Payer: Self-pay

## 2013-08-29 ENCOUNTER — Encounter: Payer: Self-pay | Admitting: Neurology

## 2013-08-29 DIAGNOSIS — M542 Cervicalgia: Secondary | ICD-10-CM

## 2013-08-29 DIAGNOSIS — M436 Torticollis: Secondary | ICD-10-CM

## 2013-08-29 DIAGNOSIS — G243 Spasmodic torticollis: Secondary | ICD-10-CM

## 2013-08-29 MED ORDER — INCOBOTULINUMTOXINA 100 UNITS IM SOLR
300.0000 [IU] | Freq: Once | INTRAMUSCULAR | Status: AC
  Administered 2013-08-29: 300 [IU] via INTRAMUSCULAR

## 2013-08-29 NOTE — Progress Notes (Signed)
[facility]  PATIENT: Julie Shelton DOB: [birth year]  HISTORICAL  Julie Shelton is a 73 years old right-handed Caucasian female, by herself, referred by her primary care physician Dr. 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  She had a past medical history of hypothyroidism, on supplement,  she was diagnosed with cervical dystonia around 2008 at [facility]  Around 2004, wiithout clear trigger, she had gradual onset of head pulling to her left shoulder, difficulty turning her neck to either direction especially to her left, later she also developed shooting pain from her left cervical region down to her left shoulder, numbness involving left fifth finger  She had MRI of cervical in 2006 at [facility], which showed loss of height with left eccentric disc osteophyte complex at C5-6, and C6 and 7, with mild central canal narrowing, minimum left foraminal narrowing at C6 and 7, repeat MRI of cervical in 2007, a year later, continued to demonstrate spondylosis at C5-6, C6 and 7, with endplate osteophyte, covering bulging disc material, mild canal stenosis, no cord compression, there is no significant foraminal stenosis.  However with her persistent left-sided neck pain, shooting pain to her left shoulder, she underwent anterior cervical discectomy, and fusion at C5-6, C6 and 7 with tricortical autograft, and plating with a short statue synthetic system C5-C7 by Dr.Roy, Ranjan [facility] in August 2008, this [place],  She only has minimum improvement of her neck pain postsurgically, eventually she was evaluated by [facility] in October 2008, was correctly diagnosed with spasmodic torticollis, has received repeat EMG guided Botox injection under supervision of Dr. Jeremy JohannJanice Massey, she was receiving injections every 3-5 months, last injection was April 2,2014, she reported mild to moderate improvement with each injection, due to  insurance change, she could no longer go to [facility] for [facility]. She desires to transfer her injection locally.  She denied any significant side effect was Botox injection, was not sure about the dosage, but reported continued mild to moderate improvement with each injection, over the past few months, because prolonged interval with the last injection, she noticed worsening neck pain, difficulty turning her neck, and the worsening abnormal posture.  She is also taking clonazepam 0.5 mg twice a day, she has significant neck pain, 8 out of 10, which has put limitation on her daily activity,  She denies gait difficulty, no bilateral arm persistent sensory loss, all weakness,  I started to give her her  EMG guided Xeomin injection in Feb 11th 2015, no significant side effect noticed either.  UPDATE May 13th 2015:  There was no significant improvement following her first EMG guided Xeomin injection in Feb 11th 2015, no significant side effect noticed either. she continued to complains of neck hyperextension, cracking sound when moving her neck, gait difficulty,  We also reviewed MRI of the cervical spine,  which showed multilevel degenerative disc disease, there is no significant canal or foraminal stenosis.  She complains of left breast area tenderness, bloating sensation  REVIEW OF SYSTEMS: Full 14 system review of systems performed and notable only for neck pain, achy muscles, headache, left breast area bloating sensation , ALLERGIES: No Known Allergies  HOME MEDICATIONS:  Clonazepam 0 point 5 mg twice a day Levothyroxine,  PAST MEDICAL HISTORY: Past Medical History  Diagnosis Date  . Torticollis   . Depression   . GERD (gastroesophageal reflux disease)   . Hypothyroidism   . Allergic rhinitis   . Vitamin D  deficiency     PAST SURGICAL HISTORY: Past Surgical History  Procedure Laterality Date  . Shoulder open rotator cuff repair left 2003  . Breast enhancement surgery   1970s.  . Laparoscopic tubal ligation    . Breast implants removed  2014  . Neck surgery,  Left cervical radiculopathy Dr. Channing Muttersoy  2008    FAMILY HISTORY: Family History  Problem Relation Age of Onset  . Dementia Father   . Thyroid disease Mother     SOCIAL HISTORY:  History   Social History  . Marital Status: Married    Spouse Name: Julie Russelldwin    Number of Children: 2  . Years of Education: 12   Occupational History    retired   Social History Main Topics  . Smoking status: Former Smoker    Types: Cigarettes  . Smokeless tobacco: Never Used     Comment: Quit in 1972  . Alcohol Use: 1.8 oz/week    3 Cans of beer per week     Comment: three at night  . Drug Use: No  . Sexual Activity: Not on file   Other Topics Concern  . Not on file   Social History Narrative   Patient lives at home with her husband Julie Shelton(Julie Shelton) .   Patient is retired.    Education- High school    Caffeine- two or three cups daily.     PHYSICAL EXAM   There were no vitals filed for this visit.   There is no weight on file to calculate BMI.   Generalized: In no acute distress  Neck: Supple, no carotid bruits   Cardiac: Regular rate rhythm  Pulmonary: Clear to auscultation bilaterally  Musculoskeletal: No deformity  Neurological examination  Mentation: Alert oriented to time, place, history taking, and causual conversation, she has constant moderate to severe  retrocollis, moderate left turning, mild right tilt, mild to moderate right shoulder elevation,  Cranial nerve II-XII: Pupils were equal round reactive to light extraocular movements were full, Visual field were full on confrontational test. Bilateral fundi were sharp.  Facial sensation and strength were normal. Hearing was intact to finger rubbing bilaterally. Uvula tongue midline.  head turning and shoulder shrug and were normal and symmetric.Tongue protrusion into cheek strength was normal.  Motor: normal tone, bulk and  strength.  Sensory: Intact to fine touch, pinprick, preserved vibratory sensation, and proprioception at toes.  Coordination: Normal finger to nose, heel-to-shin bilaterally there was no truncal ataxia  Gait: Rising up from seated position without assistance, normal stance, without trunk ataxia, moderate stride, good arm swing, smooth turning, able to perform tiptoe, and heel walking without difficulty.   Romberg signs: Negative  Deep tendon reflexes: Brachioradialis 2/2, biceps 2/2, triceps 2/2, patellar 2/2, Achilles 2/2, plantar responses were flexor bilaterally.   DIAGNOSTIC DATA (LABS, IMAGING, TESTING) - I reviewed patient records, labs, notes, testing and imaging myself where available.  ASSESSMENT AND PLAN   73 years old Caucasian female, with a long-standing history of cervical dystonia, responding mild to moderately to previous Botox A injection,   EMG guided  xeomin injections, 300 units was used.  Right oblique capitis inferior , 25 units Left oblique capitis inferior 25 units   Right longissimus capitis  25 units  Right semispinalis capitis 25 units  Left semispinalis capitis 25 units  Right splenius capitis 25 units x2=50 Right Splenius cervix 25 x 3=75  Left splenius capitis 25 units x2= 50 units    I also injected 5-10 units to  her right frontalis muscles, to see if she would respond to xeomin.  Levert Feinstein, M.D. Ph.D.  [facility] [address] [place], [place] [address] [phone]

## 2013-08-29 NOTE — Addendum Note (Signed)
Addended by: Levert FeinsteinYAN, Chadd Tollison on: 08/29/2013 03:11 PM   Modules accepted: Orders

## 2013-09-12 ENCOUNTER — Telehealth: Payer: Self-pay | Admitting: Neurology

## 2013-09-12 MED ORDER — CLONAZEPAM 0.5 MG PO TABS: 0.5000 mg | ORAL_TABLET | Freq: Two times a day (BID) | ORAL | Status: DC

## 2013-09-12 NOTE — Telephone Encounter (Signed)
Patient called the office requesting a Refill on Clonazepam 0.5mg  one twice daily #180 + 1 refill.  She would like it sent to [facility] in [place].   It does not appear we have prescribed this before.  Would you like to fill?  Please advise.  Thank you.

## 2013-09-12 NOTE — Telephone Encounter (Signed)
I called the patient back.  Got no answer.  Left message. 

## 2013-09-12 NOTE — Telephone Encounter (Signed)
Patient calling requesting refill of clonazePAM (KLONOPIN) 0.5 MG tablet.  Thanks

## 2013-09-12 NOTE — Telephone Encounter (Signed)
I have refill her clonazepam 0.5mg  bid x180 tabs with one refill

## 2013-12-19 ENCOUNTER — Encounter: Payer: Self-pay | Admitting: Neurology

## 2013-12-19 ENCOUNTER — Ambulatory Visit (INDEPENDENT_AMBULATORY_CARE_PROVIDER_SITE_OTHER): Payer: Medicare Other | Admitting: Neurology

## 2013-12-19 DIAGNOSIS — G8929 Other chronic pain: Secondary | ICD-10-CM

## 2013-12-19 DIAGNOSIS — M436 Torticollis: Secondary | ICD-10-CM

## 2013-12-19 DIAGNOSIS — G243 Spasmodic torticollis: Secondary | ICD-10-CM

## 2013-12-19 DIAGNOSIS — M542 Cervicalgia: Secondary | ICD-10-CM

## 2013-12-19 MED ORDER — INCOBOTULINUMTOXINA 100 UNITS IM SOLR
400.0000 [IU] | Freq: Once | INTRAMUSCULAR | Status: AC
  Administered 2013-12-19: 400 [IU] via INTRAMUSCULAR

## 2013-12-19 NOTE — Progress Notes (Signed)
[facility]  PATIENT: Julie Shelton DOB: 10-23-1940  HISTORICAL  Julie Shelton is a 73 years old right-handed Caucasian female, by herself, referred by her primary care physician Dr. 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  She had a past medical history of hypothyroidism, on supplement,  she was diagnosed with cervical dystonia around 2008 at [facility]  Around 2004, wiithout clear trigger, she had gradual onset of head pulling to her left shoulder, difficulty turning her neck to either direction especially to her left, later she also developed shooting pain from her left cervical region down to her left shoulder, numbness involving left fifth finger  She had MRI of cervical in 2006 at [facility], which showed loss of height with left eccentric disc osteophyte complex at C5-6, and C6 and 7, with mild central canal narrowing, minimum left foraminal narrowing at C6 and 7, repeat MRI of cervical in 2007, a year later, continued to demonstrate spondylosis at C5-6, C6 and 7, with endplate osteophyte, covering bulging disc material, mild canal stenosis, no cord compression, there is no significant foraminal stenosis.  However with her persistent left-sided neck pain, shooting pain to her left shoulder, she underwent anterior cervical discectomy, and fusion at C5-6, C6 and 7 with tricortical autograft, and plating with a short statue synthetic system C5-C7 by Dr.Roy, Ranjan at [facility] in August 2008, this was done at [place], [place],  She only has minimum improvement of her neck pain postsurgically, eventually she was evaluated by [facility] in October 2008, was correctly diagnosed with spasmodic torticollis, has received repeat EMG guided Botox injection under supervision of Dr. Jeremy Johann, she was receiving injections every 3-5 months, last injection was April 2,2014, she reported mild to moderate improvement with each injection, due to  insurance change, she could no longer go to [facility] for continued treatment. She desires to transfer her injection locally.  She denied any significant side effect was Botox injection, was not sure about the dosage, but reported continued mild to moderate improvement with each injection, over the past few months, because prolonged interval with the last injection, she noticed worsening neck pain, difficulty turning her neck, and the worsening abnormal posture.  She is also taking clonazepam 0.5 mg twice a day, she has significant neck pain, 8 out of 10, which has put limitation on her daily activity,  She denies gait difficulty, no bilateral arm persistent sensory loss, all weakness,  I started to give her her  EMG guided Xeomin injection in Feb 11th 2015, no significant side effect noticed either.  UPDATE May 13th 2015:  There was no significant improvement following her first EMG guided Xeomin injection in Feb 11th 2015, no significant side effect noticed either. she continued to complains of neck hyperextension, cracking sound when moving her neck, gait difficulty,  We also reviewed MRI of the cervical spine,  which showed multilevel degenerative disc disease, there is no significant canal or foraminal stenosis.  She complains of left breast area tenderness, bloating sensation.  UPDATE Sep 2nd 2015: We did experimental right frontalis xeomin injection in last visit in May 2015, she did notice significant decreased frontalis movement, which has establish that xeomin should still be effective botulinum toxin for her, we did 300 mg of xeomin in last injection May 2015. Reported only mild improvement in her left neck pain, and neck posturing, there was no significant side effect.   REVIEW OF SYSTEMS: Full 14 system review of systems performed and notable  only for neck pain, achy muscles, headache, left breast area bloating sensation , ALLERGIES: No Known Allergies  HOME  MEDICATIONS:  Clonazepam 0 point 5 mg twice a day Levothyroxine,  PAST MEDICAL HISTORY: Past Medical History  Diagnosis Date  . Torticollis   . Depression   . GERD (gastroesophageal reflux disease)   . Hypothyroidism   . Allergic rhinitis   . Vitamin D deficiency     PAST SURGICAL HISTORY: Past Surgical History  Procedure Laterality Date  . Shoulder open rotator cuff repair left 2003  . Breast enhancement surgery  1970s.  . Laparoscopic tubal ligation    . Breast implants removed  2014  . Neck surgery,  Left cervical radiculopathy Dr. Channing Mutters  2008    FAMILY HISTORY: Family History  Problem Relation Age of Onset  . Dementia Father   . Thyroid disease Mother     SOCIAL HISTORY:  History   Social History  . Marital Status: Married    Spouse Name: Julie Shelton    Number of Children: 2  . Years of Education: 12   Occupational History    retired   Social History Main Topics  . Smoking status: Former Smoker    Types: Cigarettes  . Smokeless tobacco: Never Used     Comment: Quit in 1972  . Alcohol Use: 1.8 oz/week    3 Cans of beer per week     Comment: three at night  . Drug Use: No  . Sexual Activity: Not on file   Other Topics Concern  . Not on file   Social History Narrative   Patient lives at home with her husband Julie Shelton) .   Patient is retired.    Education- High school    Caffeine- two or three cups daily.     PHYSICAL EXAM   Filed Vitals:     Cannot calculate BMI with a height equal to zero.   Generalized: In no acute distress  Neck: Supple, no carotid bruits   Cardiac: Regular rate rhythm  Pulmonary: Clear to auscultation bilaterally  Musculoskeletal: No deformity  Neurological examination  Mentation: Alert oriented to time, place, history taking, and causual conversation, she has constant moderate to severe  retrocollis, moderate left turning, moderate, right tilt,  mild  right shoulder elevation,Significant atrophy of left  semispinalis, splenius capitis   Cranial nerve II-XII: Pupils were equal round reactive to light extraocular movements were full, Visual field were full on confrontational test. Bilateral fundi were sharp.  Facial sensation and strength were normal. Hearing was intact to finger rubbing bilaterally. Uvula tongue midline.  head turning and shoulder shrug and were normal and symmetric.Tongue protrusion into cheek strength was normal.  Motor: normal tone, bulk and strength.  Sensory: Intact to fine touch, pinprick, preserved vibratory sensation, and proprioception at toes.  Coordination: Normal finger to nose, heel-to-shin bilaterally there was no truncal ataxia  Gait: Rising up from seated position without assistance, normal stance, worsening cervical dystonia posturing     Deep tendon reflexes: Brachioradialis 2/2, biceps 2/2, triceps 2/2, patellar 2/2, Achilles 2/2, plantar responses were flexor bilaterally.   DIAGNOSTIC DATA (LABS, IMAGING, TESTING) - I reviewed patient records, labs, notes, testing and imaging myself where available.  ASSESSMENT AND PLAN   73 years old Caucasian female, with a long-standing history of cervical dystonia, responding mild to moderately to previous Botox A injection,   EMG guided  xeomin injections, 400 units was used.  Right iliocostalis 25 units   Right upper trapezius 25  units  Right longissimus capitis  25 units  Right semispinalis capitis 25 units   Left semispinalis capitis 25x4= 100  Units  Left splenius capitis 25 units x2= 50 units Left levator scapular 25 units Left latissimus capitis 25 units  Left splenius cervix spreaded at different levels 100 units   return to clinic in 3 months for repeat injection   Levert Feinstein, M.D. Ph.D.  [facility] [address], [address] [place], [place] [address] [phone]

## 2014-03-01 ENCOUNTER — Other Ambulatory Visit: Payer: Self-pay

## 2014-03-01 DIAGNOSIS — Z1231 Encounter for screening mammogram for malignant neoplasm of breast: Secondary | ICD-10-CM

## 2014-03-16 ENCOUNTER — Other Ambulatory Visit: Payer: Self-pay | Admitting: Neurology

## 2014-03-18 ENCOUNTER — Other Ambulatory Visit: Payer: Self-pay

## 2014-03-18 MED ORDER — CLONAZEPAM 0.5 MG PO TABS: 0.5000 mg | ORAL_TABLET | Freq: Two times a day (BID) | ORAL | Status: DC

## 2014-03-20 ENCOUNTER — Ambulatory Visit: Payer: Medicare Other | Admitting: Neurology

## 2014-03-27 ENCOUNTER — Ambulatory Visit
Admission: RE | Admit: 2014-03-27 | Discharge: 2014-03-27 | Disposition: A | Discharge status: Home / Self Care | Payer: Medicare Other | Source: Ambulatory Visit

## 2014-03-27 DIAGNOSIS — Z1231 Encounter for screening mammogram for malignant neoplasm of breast: Secondary | ICD-10-CM

## 2014-04-24 ENCOUNTER — Encounter: Payer: Self-pay | Admitting: Neurology

## 2014-04-24 ENCOUNTER — Ambulatory Visit (INDEPENDENT_AMBULATORY_CARE_PROVIDER_SITE_OTHER): Payer: 59 | Admitting: Neurology

## 2014-04-24 DIAGNOSIS — G243 Spasmodic torticollis: Secondary | ICD-10-CM | POA: Insufficient documentation

## 2014-04-24 DIAGNOSIS — M542 Cervicalgia: Secondary | ICD-10-CM

## 2014-04-24 MED ORDER — INCOBOTULINUMTOXINA 100 UNITS IM SOLR
300.0000 [IU] | Freq: Once | INTRAMUSCULAR | Status: AC
  Administered 2014-04-24: 300 [IU] via INTRAMUSCULAR

## 2014-04-24 MED ORDER — INCOBOTULINUMTOXINA 50 UNITS IM SOLR
100.0000 [IU] | Freq: Once | INTRAMUSCULAR | Status: AC
  Administered 2014-04-24: 100 [IU] via INTRAMUSCULAR

## 2014-04-24 MED ORDER — GABAPENTIN 300 MG PO CAPS: 300.0000 mg | ORAL_CAPSULE | Freq: Three times a day (TID) | ORAL | Status: DC

## 2014-04-24 NOTE — Progress Notes (Signed)
[facility]  PATIENT: Frederick Peersdna R Sees DOB: 1941-03-10  HISTORICAL  Marily Memosdna is a 74 years old right-handed Caucasian female, by herself, referred by her primary care physician Dr. 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  She had a past medical history of hypothyroidism, on supplement,  she was diagnosed with cervical [facility] around [facility] at [facility]  Around 2004, wiithout clear trigger, she had gradual onset of head pulling to her left shoulder, difficulty turning her neck to either direction especially to her left, later she also developed shooting pain from her left cervical region down to her left shoulder, numbness involving left fifth finger  She had MRI of cervical in 2006 at [facility], which showed loss of height with left eccentric disc osteophyte complex at C5-6, and C6 and 7, with mild central canal narrowing, minimum left foraminal narrowing at C6 and 7, repeat MRI of cervical in 2007, a year later, continued to demonstrate spondylosis at C5-6, C6 and 7, with endplate osteophyte, covering bulging disc material, mild canal stenosis, no cord compression, there is no significant foraminal stenosis.  However with her persistent left-sided neck pain, shooting pain to her left shoulder, she underwent anterior cervical discectomy, and fusion at C5-6, C6 and 7 with tricortical autograft, and plating with a short statue synthetic system C5-C7 by Dr.Roy, Ranjan at [facility] in August 2008, this was [place],  She only has minimum improvement of her neck pain postsurgically, eventually she was evaluated by [facility] in October 2008, was correctly diagnosed with spasmodic torticollis, has received repeat EMG guided Botox injection under supervision of Dr. Jeremy JohannJanice Massey, she was receiving injections every 3-5 months, last injection was April 2,2014, she reported mild to moderate improvement with each injection, due to  insurance change, she [facility] no [facility]. She desires to transfer her injection locally.  She denied any significant side effect was Botox injection, was not sure about the dosage, but reported continued mild to moderate improvement with each injection, over the past few months, because prolonged interval with the last injection, she noticed worsening neck pain, difficulty turning her neck, and the worsening abnormal posture.  She is also taking clonazepam 0.5 mg twice a day, she has significant neck pain, 8 out of 10, which has put limitation on her daily activity,  She denies gait difficulty, no bilateral arm persistent sensory loss, all weakness,  I started to give her her  EMG guided Xeomin injection in Feb 11th 2015, no significant side effect noticed either.  UPDATE May 13th 2015:  There was no significant improvement following her first EMG guided Xeomin injection in Feb 11th 2015, no significant side effect noticed either. she continued to complains of neck hyperextension, cracking sound when moving her neck, gait difficulty,  We also reviewed MRI of the cervical spine,  which showed multilevel degenerative disc disease, there is no significant canal or foraminal stenosis.  She complains of left breast area tenderness, bloating sensation.  UPDATE Sep 2nd 2015: We did experimental right frontalis xeomin injection in last visit in May 2015, she did notice significant decreased frontalis movement, which has establish that xeomin should still be effective botulinum toxin for her, we did 300 mg of xeomin in last injection May 2015. Reported only mild improvement in her left neck pain, and neck posturing, there was no significant side effect.  Update April 24 2014: She received 400 units of Xeomin injection in September  second 2015, she reported moderate improvement, but continue have moderate disabilitating left posterior neck pain, no significant side effect  noticed  REVIEW OF SYSTEMS: Full 14 system review of systems performed and notable only for neck pain, achy muscles, headache, left breast area bloating sensation , ALLERGIES: No Known Allergies  HOME MEDICATIONS:  Clonazepam 0 point 5 mg twice a day Levothyroxine,  PAST MEDICAL HISTORY: Past Medical History  Diagnosis Date  . Torticollis   . Depression   . GERD (gastroesophageal reflux disease)   . Hypothyroidism   . Allergic rhinitis   . Vitamin D deficiency     PAST SURGICAL HISTORY: Past Surgical History  Procedure Laterality Date  . Shoulder open rotator cuff repair left 2003  . Breast enhancement surgery  1970s.  . Laparoscopic tubal ligation    . Breast implants removed  2014  . Neck surgery,  Left cervical radiculopathy Dr. Channing Muttersoy  2008    FAMILY HISTORY: Family History  Problem Relation Age of Onset  . Dementia Father   . Thyroid disease Mother     SOCIAL HISTORY:  History   Social History  . Marital Status: Married    Spouse Name: [name]    Number of Children: 2  . Years of Education: 12   Occupational History    retired   Social History Main Topics  . Smoking status: Former Smoker    Types: Cigarettes  . Smokeless tobacco: Never Used     Comment: Quit in 1972  . Alcohol Use: 1.8 oz/week    3 Cans of beer per week     Comment: three at night  . Drug Use: No  . Sexual Activity: Not on file   Other Topics Concern  . Not on file   Social History Narrative   Patient lives at home with her husband Dorma Russell([name]) .   Patient is retired.    Education- High school    Caffeine- two or three cups daily.     PHYSICAL EXAM   There were no vitals filed for this visit.   There is no weight on file to calculate BMI.   Generalized: In no acute distress  Neck: Supple, no carotid bruits   Cardiac: Regular rate rhythm  Pulmonary: Clear to auscultation bilaterally  Musculoskeletal: No deformity  Neurological examination  Mentation: She has  significant hypertrophy of posterior cervical muscles, especially left upper cervical muscles, she has constant moderate to severe  retrocollis, mild left turning, moderate right tilt,  mild left shoulder elevation,  Cranial nerve II-XII: Pupils were equal round reactive to light extraocular movements were full, Visual field were full on confrontational test. Bilateral fundi were sharp.  Facial sensation and strength were normal. Hearing was intact to finger rubbing bilaterally. Uvula tongue midline.  head turning and shoulder shrug and were normal and symmetric.Tongue protrusion into cheek strength was normal.  Motor: normal tone, bulk and strength.  Sensory: Intact to fine touch, pinprick, preserved vibratory sensation, and proprioception at toes.  Coordination: Normal finger to nose, heel-to-shin bilaterally there was no truncal ataxia  Gait: Rising up from seated position without assistance, normal stance, worsening cervical dystonia posturing     Deep tendon reflexes: Brachioradialis 2/2, biceps 2/2, triceps 2/2, patellar 2/2, Achilles 2/2, plantar responses were flexor bilaterally.   DIAGNOSTIC DATA (LABS, IMAGING, TESTING) - I reviewed patient records, labs, notes, testing and imaging myself where available.  ASSESSMENT AND PLAN   74 years old Caucasian female, with a long-standing history of cervical  dystonia, responding mild to moderately to previous Botox A injection,   EMG guided  xeomin injections, 400 units was used.  Right splenius capitis 25 units Right splenius cervix 25 unitsx2= 50 units Right levator scapular 25 units Right inferior oblique rectus 25 units   Left longissimus capitis 25 units Left semispinalis capitis 25x4= 100  Units  left inferior oblique rectus 25 units   Left splenius capitis 25 units x2= 50 units Left levator scapular 25 units Left splenius cervix spreaded at different levels 100 units   return to clinic in 3 months for repeat injection    Levert Feinstein, M.D. Ph.D.  [facility] [address] [place], [place] [address] [phone]

## 2014-05-01 ENCOUNTER — Encounter: Payer: Self-pay | Admitting: Neurology

## 2014-05-20 ENCOUNTER — Encounter: Payer: Self-pay | Admitting: Neurology

## 2014-06-18 ENCOUNTER — Encounter
Admit: 2014-06-18 | Disposition: A | Discharge status: Home / Self Care | Payer: Self-pay | Attending: Neurology | Admitting: Neurology

## 2014-07-19 ENCOUNTER — Encounter
Admit: 2014-07-19 | Disposition: A | Discharge status: Home / Self Care | Payer: Self-pay | Attending: Neurology | Admitting: Neurology

## 2014-08-07 ENCOUNTER — Ambulatory Visit: Payer: 59 | Admitting: Neurology

## 2014-08-12 ENCOUNTER — Telehealth: Payer: Self-pay | Admitting: Neurology

## 2014-08-12 NOTE — Telephone Encounter (Signed)
I have attempted to contact and left messages for patient on Four different occassions. 4/11;4/15; 4/18 and 4/25 advising her she needs to contact [facility] to give consent for shipment of medication and she has not responded or called the pharmacy. In the message today I advised her that if she does not respond by the end of business today her appointment will be rescheduled as we will not be able to get the medication on time.

## 2014-08-13 ENCOUNTER — Telehealth: Payer: Self-pay | Admitting: Neurology

## 2014-08-13 NOTE — Telephone Encounter (Signed)
It is okay to proceed with preauthorization for Botox injection for her cervical dystonia, 400 units of Botox a

## 2014-08-14 ENCOUNTER — Ambulatory Visit: Payer: 59 | Admitting: Neurology

## 2014-08-28 ENCOUNTER — Encounter: Payer: Self-pay | Admitting: *Deleted

## 2014-08-28 ENCOUNTER — Ambulatory Visit (INDEPENDENT_AMBULATORY_CARE_PROVIDER_SITE_OTHER): Payer: Medicare Other | Admitting: Neurology

## 2014-08-28 ENCOUNTER — Encounter: Payer: Self-pay | Admitting: Neurology

## 2014-08-28 VITALS — BP 126/65 | HR 68 | Ht 66.0 in | Wt 185.0 lb

## 2014-08-28 DIAGNOSIS — M542 Cervicalgia: Secondary | ICD-10-CM

## 2014-08-28 DIAGNOSIS — G243 Spasmodic torticollis: Secondary | ICD-10-CM | POA: Diagnosis not present

## 2014-08-28 NOTE — Progress Notes (Signed)
[facility]  PATIENT: Frederick Peersdna R Sees DOB: 1941-03-10  HISTORICAL  Marily Memosdna is a 74 years old right-handed Caucasian female, by herself, referred by her primary care physician Dr. 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  She had a past medical history of hypothyroidism, on supplement,  she was diagnosed with cervical dystonia around 2008 at [facility]  Around 2004, wiithout clear trigger, she had gradual onset of head pulling to her left shoulder, difficulty turning her neck to either direction especially to her left, later she also developed shooting pain from her left cervical region down to her left shoulder, numbness involving left fifth finger  She had MRI of cervical in 2006 at [facility], which showed loss of height with left eccentric disc osteophyte complex at C5-6, and C6 and 7, with mild central canal narrowing, minimum left foraminal narrowing at C6 and 7, repeat MRI of cervical in 2007, a year later, continued to demonstrate spondylosis at C5-6, C6 and 7, with endplate osteophyte, covering bulging disc material, mild canal stenosis, no cord compression, there is no significant foraminal stenosis.  However with her persistent left-sided neck pain, shooting pain to her left shoulder, she underwent anterior cervical discectomy, and fusion at C5-6, C6 and 7 with tricortical autograft, and plating with a short statue synthetic system C5-C7 by Dr.Roy, Ranjan at [facility] in August 2008, this was done [place],  She only has minimum improvement of her neck pain postsurgically, eventually she was evaluated by [facility] in October 2008, was correctly diagnosed with spasmodic torticollis, has received repeat EMG guided Botox injection under supervision of Dr. Jeremy JohannJanice Massey, she was receiving injections every 3-5 months, last injection was April 2,2014, she reported mild to moderate improvement with each injection, due to  insurance change, she could no longer go [facility]. She desires to transfer her injection locally.  She denied any significant side effect was Botox injection, was not sure about the dosage, but reported continued mild to moderate improvement with each injection, over the past few months, because prolonged interval with the last injection, she noticed worsening neck pain, difficulty turning her neck, and the worsening abnormal posture.  She is also taking clonazepam 0.5 mg twice a day, she has significant neck pain, 8 out of 10, which has put limitation on her daily activity,  She denies gait difficulty, no bilateral arm persistent sensory loss, all weakness,  I started to give her her  EMG guided Xeomin injection in Feb 11th 2015, no significant side effect noticed either.  UPDATE May 13th 2015:  There was no significant improvement following her first EMG guided Xeomin injection in Feb 11th 2015, no significant side effect noticed either. she continued to complains of neck hyperextension, cracking sound when moving her neck, gait difficulty,  We also reviewed MRI of the cervical spine,  which showed multilevel degenerative disc disease, there is no significant canal or foraminal stenosis.  She complains of left breast area tenderness, bloating sensation.  UPDATE Sep 2nd 2015: We did experimental right frontalis xeomin injection in last visit in May 2015, she did notice significant decreased frontalis movement, which has establish that xeomin should still be effective botulinum toxin for her, we did 300 mg of xeomin in last injection May 2015. Reported only mild improvement in her left neck pain, and neck posturing, there was no significant side effect.  Update April 24 2014: She received 400 units of Xeomin injection in September  second 2015, she reported moderate improvement, but continue have moderate disabilitating left posterior neck pain, no significant side effect  noticed  Update Aug 28 2014: Last injection in January 2016 works well for her, this time, insurance approved for Botox only, not as previously xeomin, 400 units,  REVIEW OF SYSTEMS: Full 14 system review of systems performed and notable only for neck pain, achy muscles, headache, left breast area bloating sensation , ALLERGIES: No Known Allergies  HOME MEDICATIONS:  Clonazepam 0 point 5 mg twice a day Levothyroxine,  PAST MEDICAL HISTORY: Past Medical History  Diagnosis Date  . Torticollis   . Depression   . GERD (gastroesophageal reflux disease)   . Hypothyroidism   . Allergic rhinitis   . Vitamin D deficiency     PAST SURGICAL HISTORY: Past Surgical History  Procedure Laterality Date  . Shoulder open rotator cuff repair left 2003  . Breast enhancement surgery  1970s.  . Laparoscopic tubal ligation    . Breast implants removed  2014  . Neck surgery,  Left cervical radiculopathy Dr. Channing Mutters  2008    FAMILY HISTORY: Family History  Problem Relation Age of Onset  . Dementia Father   . Thyroid disease Mother     SOCIAL HISTORY:  History   Social History  . Marital Status: Married    Spouse Name: Dorma Russell    Number of Children: 2  . Years of Education: 12   Occupational History    retired   Social History Main Topics  . Smoking status: Former Smoker    Types: Cigarettes  . Smokeless tobacco: Never Used     Comment: Quit in 1972  . Alcohol Use: 1.8 oz/week    3 Cans of beer per week     Comment: three at night  . Drug Use: No  . Sexual Activity: Not on file   Other Topics Concern  . Not on file   Social History Narrative   Patient lives at home with her husband Dorma Russell) .   Patient is retired.    Education- High school    Caffeine- two or three cups daily.     PHYSICAL EXAM   Filed Vitals:   08/28/14 1521  BP: 126/65  Pulse: 68  Height:  (1.676 m)  Weight: 185 lb (83.915 kg)     Body mass index is 29.87 kg/(m^2).   Generalized: In  no acute distress  Neck: Supple, no carotid bruits   Cardiac: Regular rate rhythm  Pulmonary: Clear to auscultation bilaterally  Musculoskeletal: No deformity  Neurological examination  Mentation: She has significant hypertrophy of posterior cervical muscles, especially left upper cervical muscles, she has constant moderate to severe retrocollis, mild left turning, mild right tilt,  Nosignificant shoulder elevation,  Cranial nerve II-XII: Pupils were equal round reactive to light extraocular movements were full, Visual field were full on confrontational test. Bilateral fundi were sharp.  Facial sensation and strength were normal. Hearing was intact to finger rubbing bilaterally. Uvula tongue midline.  head turning and shoulder shrug and were normal and symmetric.Tongue protrusion into cheek strength was normal.  Motor: normal tone, bulk and strength.  Sensory: Intact to fine touch, pinprick, preserved vibratory sensation, and proprioception at toes.  Coordination: Normal finger to nose, heel-to-shin bilaterally there was no truncal ataxia  Gait: Rising up from seated position without assistance, normal stance, worsening cervical dystonia posturing     Deep tendon reflexes: Brachioradialis 2/2, biceps 2/2, triceps 2/2, patellar 2/2, Achilles 2/2, plantar responses  were flexor bilaterally.   DIAGNOSTIC DATA (LABS, IMAGING, TESTING) - I reviewed patient records, labs, notes, testing and imaging myself where available.  ASSESSMENT AND PLAN   74 years old Caucasian female, with a long-standing history of cervical dystonia, responding mild to moderately to previous Botox A injection,   EMG guided Botox injections, 400 units was used.  Right splenius capitis 25x2 units=50 units Right splenius cervix 25 unitsx2= 50 units Right semispinalis capitis 25x2 units=50 Right inferior oblique rectus 50 units   Left longissimus capitis 25 unitsx2=50 units Left semispinalis capitis 25 2 = 50  Units Left inferior oblique rectus 50 units   Left splenius capitis 25 units x2= 50 units    return to clinic in 3 months for repeat injection   She also complains of bilateral occipital cervical region, left TMJ joint pain, I have given her prescription gabapentin 300 mg 3 times a day in the past, she complains of excessive sleepiness with the medications, only take once daily, it does help her some, I have suggested her increase to 300 mg 3 times a day  Levert FeinsteinYijun Tanaja Ganger, M.D. Ph.D.  [facility] [address], [address] [place], [place] [id number] [phone]

## 2014-08-28 NOTE — Progress Notes (Signed)
**  Botox Lot [id number], Exp. 03/2017**[id number] 

## 2014-11-29 ENCOUNTER — Telehealth: Payer: Self-pay

## 2014-11-29 NOTE — Telephone Encounter (Signed)
Called pt to let her know to contact [facility] to have Botox shipped to our practice. Pt agreed.

## 2014-12-02 NOTE — Telephone Encounter (Signed)
Pt called in this morning. She stated her co-pay would be $1200 and she could not afford it. Offered Botox savings information.

## 2014-12-02 NOTE — Telephone Encounter (Signed)
Patient returned Julie Shelton's call. She is going to funeral today.

## 2014-12-02 NOTE — Telephone Encounter (Signed)
Patient is returning your call.  

## 2014-12-02 NOTE — Telephone Encounter (Signed)
This encounter was created in error - please disregard.

## 2014-12-03 ENCOUNTER — Ambulatory Visit: Payer: Self-pay | Admitting: Neurology

## 2014-12-05 NOTE — Telephone Encounter (Signed)
Called pt to inform her we were going to do B/B for her botox and we could go ahead and schedule apt. She did not answer so I left her a VM asking her to return my call.

## 2014-12-09 NOTE — Telephone Encounter (Signed)
Spoke with pt and informed her that we could go ahead and set up apt. She stated that she will call us at a later time to schedule botox apt.

## 2015-03-28 ENCOUNTER — Other Ambulatory Visit: Payer: Self-pay

## 2015-03-28 DIAGNOSIS — Z9886 Personal history of breast implant removal: Secondary | ICD-10-CM

## 2015-03-28 DIAGNOSIS — Z1231 Encounter for screening mammogram for malignant neoplasm of breast: Secondary | ICD-10-CM

## 2015-04-17 ENCOUNTER — Ambulatory Visit: Payer: Self-pay

## 2015-05-23 ENCOUNTER — Ambulatory Visit: Payer: Self-pay

## 2015-05-28 ENCOUNTER — Ambulatory Visit
Admission: RE | Admit: 2015-05-28 | Discharge: 2015-05-28 | Disposition: A | Discharge status: Home / Self Care | Payer: Medicare Other | Source: Ambulatory Visit

## 2015-05-28 DIAGNOSIS — Z1231 Encounter for screening mammogram for malignant neoplasm of breast: Secondary | ICD-10-CM

## 2015-05-28 DIAGNOSIS — Z9886 Personal history of breast implant removal: Secondary | ICD-10-CM

## 2016-05-04 ENCOUNTER — Other Ambulatory Visit: Payer: Self-pay | Admitting: Obstetrics and Gynecology

## 2016-05-04 DIAGNOSIS — Z1231 Encounter for screening mammogram for malignant neoplasm of breast: Secondary | ICD-10-CM

## 2016-05-04 DIAGNOSIS — Z9886 Personal history of breast implant removal: Secondary | ICD-10-CM

## 2016-05-28 ENCOUNTER — Ambulatory Visit
Admission: RE | Admit: 2016-05-28 | Discharge: 2016-05-28 | Disposition: A | Discharge status: Home / Self Care | Payer: Medicare Other | Source: Ambulatory Visit | Attending: Obstetrics and Gynecology | Admitting: Obstetrics and Gynecology

## 2016-05-28 DIAGNOSIS — Z9886 Personal history of breast implant removal: Secondary | ICD-10-CM

## 2016-05-28 DIAGNOSIS — Z1231 Encounter for screening mammogram for malignant neoplasm of breast: Secondary | ICD-10-CM

## 2016-06-16 ENCOUNTER — Ambulatory Visit
Admission: RE | Admit: 2016-06-16 | Discharge: 2016-06-16 | Disposition: A | Discharge status: Home / Self Care | Payer: Medicare Other | Source: Ambulatory Visit | Attending: Podiatry | Admitting: Podiatry

## 2016-06-16 ENCOUNTER — Other Ambulatory Visit: Payer: Self-pay | Admitting: Podiatry

## 2016-06-16 DIAGNOSIS — M7989 Other specified soft tissue disorders: Secondary | ICD-10-CM

## 2016-06-16 DIAGNOSIS — I82811 Embolism and thrombosis of superficial veins of right lower extremities: Secondary | ICD-10-CM | POA: Diagnosis not present

## 2016-06-16 DIAGNOSIS — M79604 Pain in right leg: Secondary | ICD-10-CM

## 2016-06-16 DIAGNOSIS — M79661 Pain in right lower leg: Secondary | ICD-10-CM | POA: Diagnosis present

## 2016-09-02 ENCOUNTER — Ambulatory Visit (INDEPENDENT_AMBULATORY_CARE_PROVIDER_SITE_OTHER): Payer: Medicare Other | Admitting: Neurology

## 2016-09-02 ENCOUNTER — Encounter (INDEPENDENT_AMBULATORY_CARE_PROVIDER_SITE_OTHER): Payer: Self-pay

## 2016-09-02 ENCOUNTER — Encounter: Payer: Self-pay | Admitting: Neurology

## 2016-09-02 VITALS — BP 132/82 | HR 60 | Ht 66.0 in | Wt 173.0 lb

## 2016-09-02 DIAGNOSIS — G243 Spasmodic torticollis: Secondary | ICD-10-CM | POA: Diagnosis not present

## 2016-09-02 NOTE — Progress Notes (Signed)
PATIENT: Julie Shelton DOB: [birth year]  Chief Complaint  Patient presents with  . Cervical Dystonia    Her last Botox injection was 08/28/14.  She has been involved with PT and had hoped therapy alone would keep her symptoms controlled.  Says the pulling of her neck has now worsened and she would like to discuss treatment.     HISTORICAL  Julie PeersEdna R Corbridge, seen in refer by      HISTORICAL  Marily Memosdna is a 76 years old right-handed Caucasian female, by herself, referred by her primary care physician Dr. 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  She had a past medical history of hypothyroidism, on supplement,  she was diagnosed with cervical [facility] around [facility] at [facility]  Around 2004, wiithout clear trigger, she had gradual onset of head pulling to her left shoulder, difficulty turning her neck to either direction especially to her left, later she also developed shooting pain from her left cervical region down to her left shoulder, numbness involving left fifth finger  She had MRI of cervical in 2006 [facility], which showed loss of height with left eccentric disc osteophyte complex at C5-6, and C6 and 7, with mild central canal narrowing, minimum left foraminal narrowing at C6 and 7, repeat MRI of cervical in 2007, a year later, continued to demonstrate spondylosis at C5-6, C6 and 7, with endplate osteophyte, covering bulging disc material, mild canal stenosis, no cord compression, there is no significant foraminal stenosis.  However with her persistent left-sided neck pain, shooting pain to her left shoulder, she underwent anterior cervical discectomy, and fusion at C5-6, C6 and 7 with tricortical autograft, and plating with a short statue synthetic system C5-C7 by Dr.Roy, Ranjan at [facility] in August 2008, [place],  She only has minimum improvement of her neck pain postsurgically, eventually she was  evaluated by [facility] in October 2008, was correctly diagnosed with spasmodic torticollis, has received repeat EMG guided Botox injection under supervision of Dr. Jeremy JohannJanice Massey, she was receiving injections every 3-5 months, last injection was April 2,2014, she reported mild to moderate improvement with each injection, due to insurance change, she could no longer [facility]. She desires to transfer her injection locally.  She denied any significant side effect was Botox injection, was not sure about the dosage, but reported continued mild to moderate improvement with each injection, over the past few months, because prolonged interval with the last injection, she noticed worsening neck pain, difficulty turning her neck, and the worsening abnormal posture.  She is also taking clonazepam 0.5 mg twice a day, she has significant neck pain, 8 out of 10, which has put limitation on her daily activity,  She denies gait difficulty, no bilateral arm persistent sensory loss, all weakness,  I started to give her her  EMG guided Xeomin injection in Feb 11th 2015, no significant side effect noticed either.  UPDATE May 13th 2015:  There was no significant improvement following her first EMG guided Xeomin injection in Feb 11th 2015, no significant side effect noticed either. she continued to complains of neck hyperextension, cracking sound when moving her neck, gait difficulty,  We also reviewed MRI of the cervical spine,  which showed multilevel degenerative disc disease, there is no significant canal or foraminal stenosis.  She complains of left breast area tenderness, bloating sensation.  UPDATE Sep 2nd 2015: We did experimental right frontalis xeomin injection in last  visit in May 2015, she did notice significant decreased frontalis movement, which has establish that xeomin should still be effective botulinum toxin for her, we did 300 mg of xeomin in last injection May 2015.  Reported only mild improvement in her left neck pain, and neck posturing, there was no significant side effect.  Update April 24 2014: She received 400 units of Xeomin injection in September second 2015, she reported moderate improvement, but continue have moderate disabilitating left posterior neck pain, no significant side effect noticed  Update Aug 28 2014: Last injection in January 2016 works well for her, this time, insurance approved for Botox only, not as previously xeomin, 400 units,  Update Sep 02 2016 She has lost follow-up since last injection in May 2016, now she complains worsening neck pain, limited range of motion, hope to resume her injection  REVIEW OF SYSTEMS: Full 14 system review of systems performed and notable only for  Leg swelling, murmur, activity change, swollen abdomen, abdominal pain, snoring, joint pain, neck pain, walking difficulty weakness dizziness headache depression anxiety  ALLERGIES: No Known Allergies  HOME MEDICATIONS: Current Outpatient Prescriptions  Medication Sig Dispense Refill  . citalopram (CELEXA) 40 MG tablet     . clonazePAM (KLONOPIN) 0.5 MG tablet Take 1 tablet (0.5 mg total) by mouth 2 (two) times daily. 180 tablet 1  . ibuprofen (ADVIL,MOTRIN) 200 MG tablet Take 200 mg by mouth every 6 (six) hours as needed.    Marland Kitchen levothyroxine (SYNTHROID, LEVOTHROID) 75 MCG tablet      No current facility-administered medications for this visit.     PAST MEDICAL HISTORY: Past Medical History:  Diagnosis Date  . Allergic rhinitis   . Chronic pain   . Depression   . GERD (gastroesophageal reflux disease)   . Hypothyroidism   . Torticollis   . Vitamin D deficiency     PAST SURGICAL HISTORY: Past Surgical History:  Procedure Laterality Date  . AUGMENTATION MAMMAPLASTY     Removed 5 years ago   . BREAST ENHANCEMENT SURGERY    . breast implants removed    . LAPAROSCOPIC TUBAL LIGATION    . NECK SURGERY    . SHOULDER OPEN ROTATOR CUFF  REPAIR     2003    FAMILY HISTORY: Family History  Problem Relation Age of Onset  . Thyroid disease Mother   . Dementia Father     SOCIAL HISTORY:  Social History   Social History  . Marital status: Married    Spouse name: Dorma Russell  . Number of children: 2  . Years of education: 12   Occupational History  .  Retired    retired   Social History Main Topics  . Smoking status: Former Smoker    Types: Cigarettes  . Smokeless tobacco: Never Used     Comment: Quit in 1972  . Alcohol use 1.8 oz/week    3 Cans of beer per week     Comment: three at night  . Drug use: No  . Sexual activity: Not on file   Other Topics Concern  . Not on file   Social History Narrative   Patient lives at home with her husband Dorma Russell) .   Patient is retired.    Education- High school    Caffeine- two or three cups daily.     PHYSICAL EXAM   Vitals:   09/02/16 1418  BP: 132/82  Pulse: 60  Weight: 173 lb (78.5 kg)  Height: 5\' 6"  (1.676 m)  Not recorded      Body mass index is 27.92 kg/m.  PHYSICAL EXAMNIATION:  Gen: NAD, conversant, well nourised, obese, well groomed                     Cardiovascular: Regular rate rhythm, no peripheral edema, warm, nontender. Eyes: Conjunctivae clear without exudates or hemorrhage Neck: Supple, no carotid bruits. Pulmonary: Clear to auscultation bilaterally   NEUROLOGICAL EXAM: She has moderate retrocollis, moderate right tilt, mild to moderate right shoulder elevation, severe limited range of motion looking to the right or left, mild sagittal anterior shift   MENTAL STATUS: Speech:    Speech is normal; fluent and spontaneous with normal comprehension.  Cognition:     Orientation to time, place and person     Normal recent and remote memory     Normal Attention span and concentration     Normal Language, naming, repeating,spontaneous speech     Fund of knowledge   CRANIAL NERVES: CN II: Visual fields are full to confrontation.  Fundoscopic exam is normal with sharp discs and no vascular changes. Pupils are round equal and briskly reactive to light. CN III, IV, VI: extraocular movement are normal. No ptosis. CN V: Facial sensation is intact to pinprick in all 3 divisions bilaterally. Corneal responses are intact.  CN VII: Face is symmetric with normal eye closure and smile. CN VIII: Hearing is normal to rubbing fingers CN IX, X: Palate elevates symmetrically. Phonation is normal. CN XI: Head turning and shoulder shrug are intact CN XII: Tongue is midline with normal movements and no atrophy.  MOTOR: There is no pronator drift of out-stretched arms. Muscle bulk and tone are normal. Muscle strength is normal.  REFLEXES: Reflexes are 2+ and symmetric at the biceps, triceps, knees, and ankles. Plantar responses are flexor.  SENSORY: Intact to light touch, pinprick, positional sensation and vibratory sensation are intact in fingers and toes.  COORDINATION: Rapid alternating movements and fine finger movements are intact. There is no dysmetria on finger-to-nose and heel-knee-shin.    GAIT/STANCE: Posture is normal. Gait is steady with normal steps, base, arm swing, and turning. Heel and toe walking are normal. Tandem gait is normal.  Romberg is absent.   DIAGNOSTIC DATA (LABS, IMAGING, TESTING) - I reviewed patient records, labs, notes, testing and imaging myself where available.   ASSESSMENT AND PLAN  IO DIEUJUSTE is a 76 y.o. female   Cervical dystonia  Moderate retrocollis, right tilt, mild to moderate right shoulder elevation, mild anterior sagittal shift  She would benefit continued botulism toxin a injection, Botox a 500 units, will use 300 unit at first injection, gradually titrating up the dosage  Return to clinic in 3-4 weeks   Levert Feinstein, M.D. Ph.D.  [facility] [address], [address] [place], [place] [address] Ph: [phone] Fax: [phone]  CC: Referring  Provider

## 2016-09-17 NOTE — Telephone Encounter (Signed)
I called to check status of botox medication, it is still pending insurance. Spoke with Kwyn at the pharmacy.

## 2016-09-20 ENCOUNTER — Telehealth: Payer: Self-pay | Admitting: Neurology

## 2016-09-20 NOTE — Telephone Encounter (Signed)
I called [facility] to check status of medication, they stated it was waiting on a PA. I called and obtained PA and then called the pharmacy back to let them know. They called to get patient consent but could not reach the patient.

## 2016-09-20 NOTE — Telephone Encounter (Signed)
I called to make patient aware that they are waiting on her consent. She did not answer so I left a VM asking her to call back. If she returns the call please give her the number to the speciality pharmacy [facility] [phone].

## 2016-09-22 ENCOUNTER — Telehealth: Payer: Self-pay | Admitting: Neurology

## 2016-09-22 ENCOUNTER — Ambulatory Visit (INDEPENDENT_AMBULATORY_CARE_PROVIDER_SITE_OTHER): Payer: Medicare Other | Admitting: Neurology

## 2016-09-22 ENCOUNTER — Encounter: Payer: Self-pay | Admitting: Neurology

## 2016-09-22 VITALS — BP 142/74 | HR 65 | Ht 66.0 in | Wt 173.8 lb

## 2016-09-22 DIAGNOSIS — G243 Spasmodic torticollis: Secondary | ICD-10-CM

## 2016-09-22 NOTE — Telephone Encounter (Addendum)
Will need BOTOX 400 units at next injection

## 2016-09-22 NOTE — Progress Notes (Signed)
**  Botox 100 units x 3 vials, NDC [id number], Lot [id number], Exp 02/2019, specialty pharmacy.//mck,rn**

## 2016-09-22 NOTE — Progress Notes (Addendum)
PATIENT: Julie Shelton DOB: Mar 16, 1941  Chief Complaint  Patient presents with  . Cervical Dystonia    Botox 300 units - specialty pharmacy     HISTORICAL  Julie PeersEdna R Sleeth, seen in refer by      HISTORICAL  Julie Shelton is a 76 years old right-handed Caucasian female, by herself, referred by her primary care physician Dr. 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  She had a past medical history of hypothyroidism, on supplement,  she was diagnosed with [facility] at [facility]  Around 2004, wiithout clear trigger, she had gradual onset of head pulling to her left shoulder, difficulty turning her neck to either direction especially to her left, later she also developed shooting pain from her left cervical region down to her left shoulder, numbness involving left fifth finger  She had MRI of cervical in 2006 at [facility], which showed loss of height with left eccentric disc osteophyte complex at C5-6, and C6 and 7, with mild central canal narrowing, minimum left foraminal narrowing at C6 and 7, repeat MRI of cervical in 2007, a year later, continued to demonstrate spondylosis at C5-6, C6 and 7, with endplate osteophyte, covering bulging disc material, mild canal stenosis, no cord compression, there is no significant foraminal stenosis.  However with her persistent left-sided neck pain, shooting pain to her left shoulder, she underwent anterior cervical discectomy, and fusion at C5-6, C6 and 7 with tricortical autograft, and plating with a short statue synthetic system C5-C7 by Dr.Roy, Ranjan at [facility] in August 2008, this was [place],  She only has minimum improvement of her neck pain postsurgically, eventually she was evaluated by [facility] in October 2008, was correctly diagnosed with spasmodic torticollis, has received repeat EMG guided Botox injection under supervision of Dr. Jeremy JohannJanice Massey, she was  receiving injections every 3-5 months, last injection was April 2,2014, she reported mild to moderate improvement with each injection, due to insurance change, she could no longer [facility]. She desires to transfer her injection locally.  She denied any significant side effect was Botox injection, was not sure about the dosage, but reported continued mild to moderate improvement with each injection, over the past few months, because prolonged interval with the last injection, she noticed worsening neck pain, difficulty turning her neck, and the worsening abnormal posture.  She is also taking clonazepam 0.5 mg twice a day, she has significant neck pain, 8 out of 10, which has put limitation on her daily activity,  She denies gait difficulty, no bilateral arm persistent sensory loss, all weakness,  I started to give her her  EMG guided Xeomin injection in Feb 11th 2015, no significant side effect noticed either.  UPDATE May 13th 2015:  There was no significant improvement following her first EMG guided Xeomin injection in Feb 11th 2015, no significant side effect noticed either. she continued to complains of neck hyperextension, cracking sound when moving her neck, gait difficulty,  We also reviewed MRI of the cervical spine,  which showed multilevel degenerative disc disease, there is no significant canal or foraminal stenosis.  She complains of left breast area tenderness, bloating sensation.  UPDATE Sep 2nd 2015: We did experimental right frontalis xeomin injection in last visit in May 2015, she did notice significant decreased frontalis movement, which has establish that xeomin should still be effective botulinum toxin for her, we did 300 mg of xeomin in last injection May  2015. Reported only mild improvement in her left neck pain, and neck posturing, there was no significant side effect.  Update April 24 2014: She received 400 units of Xeomin injection in  September second 2015, she reported moderate improvement, but continue have moderate disabilitating left posterior neck pain, no significant side effect noticed  Update Aug 28 2014: Last injection in January 2016 works well for her, this time, insurance approved for Botox only, not as previously xeomin, 400 units,  Update Sep 02 2016 She has lost follow-up since last injection in May 2016, now she complains worsening neck pain, limited range of motion, hope to resume her injection  UPDATE September 22 2016: She came in for EMG guided BOTOX injection, we used 300 units   day  REVIEW OF SYSTEMS: Full 14 system review of systems performed and notable only for  Leg swelling, murmur, activity change, swollen abdomen, abdominal pain, snoring, joint pain, neck pain, walking difficulty weakness dizziness headache depression anxiety  ALLERGIES: No Known Allergies  HOME MEDICATIONS: Current Outpatient Prescriptions  Medication Sig Dispense Refill  . BOTOX 100 units SOLR injection Inject 300 Units into the muscle every 3 (three) months.    . citalopram (CELEXA) 40 MG tablet     . clonazePAM (KLONOPIN) 0.5 MG tablet Take 1 tablet (0.5 mg total) by mouth 2 (two) times daily. 180 tablet 1  . ibuprofen (ADVIL,MOTRIN) 200 MG tablet Take 200 mg by mouth every 6 (six) hours as needed.    Marland Kitchen levothyroxine (SYNTHROID, LEVOTHROID) 75 MCG tablet      No current facility-administered medications for this visit.     PAST MEDICAL HISTORY: Past Medical History:  Diagnosis Date  . Allergic rhinitis   . Chronic pain   . Depression   . GERD (gastroesophageal reflux disease)   . Hypothyroidism   . Torticollis   . Vitamin D deficiency     PAST SURGICAL HISTORY: Past Surgical History:  Procedure Laterality Date  . AUGMENTATION MAMMAPLASTY     Removed 5 years ago   . BREAST ENHANCEMENT SURGERY    . breast implants removed    . LAPAROSCOPIC TUBAL LIGATION    . NECK SURGERY    . SHOULDER OPEN ROTATOR CUFF  REPAIR     2003    FAMILY HISTORY: Family History  Problem Relation Age of Onset  . Thyroid disease Mother   . Dementia Father     SOCIAL HISTORY:  Social History   Social History  . Marital status: Married    Spouse name: Dorma Russell  . Number of children: 2  . Years of education: 12   Occupational History  .  Retired    retired   Social History Main Topics  . Smoking status: Former Smoker    Types: Cigarettes  . Smokeless tobacco: Never Used     Comment: Quit in 1972  . Alcohol use 1.8 oz/week    3 Cans of beer per week     Comment: three at night  . Drug use: No  . Sexual activity: Not on file   Other Topics Concern  . Not on file   Social History Narrative   Patient lives at home with her husband Dorma Russell) .   Patient is retired.    Education- High school    Caffeine- two or three cups daily.     PHYSICAL EXAM   Vitals:   09/22/16 1027  BP: (!) 142/74  Pulse: 65  Weight: 173 lb 12 oz (78.8 kg)  Height: 5\' 6"  (1.676 m)    Not recorded      Body mass index is 28.04 kg/m.  PHYSICAL EXAMNIATION:  Gen: NAD, conversant, well nourised, obese, well groomed                     Cardiovascular: Regular rate rhythm, no peripheral edema, warm, nontender. Eyes: Conjunctivae clear without exudates or hemorrhage Neck: Supple, no carotid bruits. Pulmonary: Clear to auscultation bilaterally   NEUROLOGICAL EXAM: She has moderate retrocollis, moderate right tilt, mild to moderate right shoulder elevation, severe limited range of motion looking to the right or left, mild sagittal anterior shift   MENTAL STATUS: Speech:    Speech is normal; fluent and spontaneous with normal comprehension.  Cognition:     Orientation to time, place and person     Normal recent and remote memory     Normal Attention span and concentration     Normal Language, naming, repeating,spontaneous speech     Fund of knowledge   CRANIAL NERVES: CN II: Visual fields are full to  confrontation. Fundoscopic exam is normal with sharp discs and no vascular changes. Pupils are round equal and briskly reactive to light. CN III, IV, VI: extraocular movement are normal. No ptosis. CN V: Facial sensation is intact to pinprick in all 3 divisions bilaterally. Corneal responses are intact.  CN VII: Face is symmetric with normal eye closure and smile. CN VIII: Hearing is normal to rubbing fingers CN IX, X: Palate elevates symmetrically. Phonation is normal. CN XI: Head turning and shoulder shrug are intact CN XII: Tongue is midline with normal movements and no atrophy.  MOTOR: There is no pronator drift of out-stretched arms. Muscle bulk and tone are normal. Muscle strength is normal.  REFLEXES: Reflexes are 2+ and symmetric at the biceps, triceps, knees, and ankles. Plantar responses are flexor.  SENSORY: Intact to light touch, pinprick, positional sensation and vibratory sensation are intact in fingers and toes.  COORDINATION: Rapid alternating movements and fine finger movements are intact. There is no dysmetria on finger-to-nose and heel-knee-shin.    GAIT/STANCE: Posture is normal. Gait is steady with normal steps, base, arm swing, and turning. Heel and toe walking are normal. Tandem gait is normal.  Romberg is absent.   DIAGNOSTIC DATA (LABS, IMAGING, TESTING) - I reviewed patient records, labs, notes, testing and imaging myself where available.   ASSESSMENT AND PLAN  Julie Shelton is a 76 y.o. female   Cervical dystonia  Moderate retrocollis, right tilt, mild to moderate right shoulder elevation, mild anterior sagittal shift  She has significant atrophy of left lateral posterior neck muscles,  We used BOTOX 300 units a day, ( 100 units/2cc of normal saline).  Right longissimus capitus 25 units Right rectus capitis posterior minor 25 units Right rectus capitis posterior major 25 units Right inferior oblique capitis 25 units Right splenius capitis 25  units Right semispinalis 25 units  Left semispinalis 25 units Left splenius capitis 25 units Left longissimus Is 25 units Left rectus capitis posterior minor 25 units Left rectus capitis posterior minor major 25 units Left inferior oblique capitis 25 units  We are asked for BOTOX 400 units at next Injection   Levert Feinstein, M.D. Ph.D.  [facility] [address] [place], [place] [address] Ph: [phone] Fax: [phone]  CC: Referring Provider

## 2016-09-23 NOTE — Telephone Encounter (Signed)
Noted  

## 2016-12-09 ENCOUNTER — Other Ambulatory Visit: Payer: Self-pay | Admitting: Neurology

## 2016-12-27 ENCOUNTER — Telehealth: Payer: Self-pay | Admitting: Neurology

## 2016-12-27 NOTE — Telephone Encounter (Signed)
Pt is asking for a call back from RN to confirm that her injection is here in time for her appointment

## 2016-12-28 NOTE — Telephone Encounter (Signed)
I returned the patients call but she did not answer so I left a VM asking her to call me back.

## 2016-12-29 ENCOUNTER — Encounter: Payer: Self-pay | Admitting: Neurology

## 2016-12-29 ENCOUNTER — Ambulatory Visit (INDEPENDENT_AMBULATORY_CARE_PROVIDER_SITE_OTHER): Payer: Medicare Other | Admitting: Neurology

## 2016-12-29 VITALS — BP 138/80 | HR 72 | Ht 66.0 in | Wt 178.0 lb

## 2016-12-29 DIAGNOSIS — G243 Spasmodic torticollis: Secondary | ICD-10-CM | POA: Diagnosis not present

## 2016-12-29 MED ORDER — ONABOTULINUMTOXINA 100 UNITS IJ SOLR: 100.0000 [IU] | Freq: Once | INTRAMUSCULAR | Status: DC

## 2016-12-29 NOTE — Progress Notes (Signed)
**  Botox 100 units x 3 vials, NDC [id number], Lot [id number], Exp 06/2019, specialty pharmacy.//mck,rn**  **Botox 100 units x 1 vial, NDC [id number], Lot [id number], Exp 07/2019, office supply.//mck,rn**

## 2016-12-29 NOTE — Progress Notes (Signed)
PATIENT: Julie Shelton DOB: [birth year]  Chief Complaint  Patient presents with  . Cervical Dystonia    Botox 100 x 4 vials (3 vials specialty pharmacy, 1 vial office supply).     HISTORICAL  Frederick Peers, seen in refer by      HISTORICAL  Julie Shelton is a 76 years old right-handed Caucasian female, by herself, referred by her primary care physician Dr. 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  She had a past medical history of hypothyroidism, on supplement,  she was diagnosed with cervical dystonia around 2008 at [facility]  Around 2004, wiithout clear trigger, she had gradual onset of head pulling to her left shoulder, difficulty turning her neck to either direction especially to her left, later she also developed shooting pain from her left cervical region down to her left shoulder, numbness involving left fifth finger  She had MRI of cervical in 2006 at [facility] imaging, which showed loss of height with left eccentric disc osteophyte complex at C5-6, and C6 and 7, with mild central canal narrowing, minimum left foraminal narrowing at C6 and 7, repeat MRI of cervical in 2007, a year later, continued to demonstrate spondylosis at C5-6, C6 and 7, with endplate osteophyte, covering bulging disc material, mild canal stenosis, no cord compression, there is no significant foraminal stenosis.  However with her persistent left-sided neck pain, shooting pain to her left shoulder, she underwent anterior cervical discectomy, and fusion at C5-6, C6 and 7 with tricortical autograft, and plating with a short statue synthetic system C5-C7 by Dr.Roy, Ranjan at [facility] in August 2008, this was done at [place], [place],  She only has minimum improvement of her neck pain postsurgically, eventually she was evaluated by [facility] in October 2008, was correctly diagnosed with spasmodic torticollis, has received repeat EMG guided Botox injection under supervision of  Dr. Jeremy Johann, she was receiving injections every 3-5 months, last injection was April 2,2014, she reported mild to moderate improvement with each injection, due to insurance change, she could no longer go to [facility] for continued treatment. She desires to transfer her injection locally.  She denied any significant side effect was Botox injection, was not sure about the dosage, but reported continued mild to moderate improvement with each injection, over the past few months, because prolonged interval with the last injection, she noticed worsening neck pain, difficulty turning her neck, and the worsening abnormal posture.  She is also taking clonazepam 0.5 mg twice a day, she has significant neck pain, 8 out of 10, which has put limitation on her daily activity,  She denies gait difficulty, no bilateral arm persistent sensory loss, all weakness,  I started to give her her  EMG guided Xeomin injection in Feb 11th 2015, no significant side effect noticed either.  UPDATE May 13th 2015:  There was no significant improvement following her first EMG guided Xeomin injection in Feb 11th 2015, no significant side effect noticed either. she continued to complains of neck hyperextension, cracking sound when moving her neck, gait difficulty,  We also reviewed MRI of the cervical spine,  which showed multilevel degenerative disc disease, there is no significant canal or foraminal stenosis.  She complains of left breast area tenderness, bloating sensation.  UPDATE Sep 2nd 2015: We did experimental right frontalis xeomin injection in last visit in May 2015, she did notice significant decreased frontalis movement, which has establish that xeomin should still be effective botulinum toxin for her, we did 300  mg of xeomin in last injection May 2015. Reported only mild improvement in her left neck pain, and neck posturing, there was no significant side effect.  Update April 24 2014: She received 400  units of Xeomin injection in September second 2015, she reported moderate improvement, but continue have moderate disabilitating left posterior neck pain, no significant side effect noticed  Update Aug 28 2014: Last injection in January 2016 works well for her, this time, insurance approved for Botox only, not as previously xeomin, 400 units,  Update Sep 02 2016 She has lost follow-up since last injection in May 2016, now she complains worsening neck pain, limited range of motion, hope to resume her injection  UPDATE September 22 2016: She came in for EMG guided BOTOX injection, we used 300 units  Today  UPDATE Sept 12 2018:   REVIEW OF SYSTEMS: Full 14 system review of systems performed and notable only for  Leg swelling, murmur, activity change, swollen abdomen, abdominal pain, snoring, joint pain, neck pain, walking difficulty weakness dizziness headache depression anxiety  ALLERGIES: No Known Allergies  HOME MEDICATIONS: Current Outpatient Prescriptions  Medication Sig Dispense Refill  . BOTOX 100 units SOLR injection INJECT 300 UNITS INTRAMUSCULARLY EVERY 3 MONTHS (GIVEN AT PRESCRIBERS OFFICE, DISCARD UNUSED AFTER 1ST USE) (Patient taking differently: INJECT 400 UNITS INTRAMUSCULARLY EVERY 3 MONTHS (GIVEN AT PRESCRIBERS OFFICE, DISCARD UNUSED AFTER 1ST USE)) 3 vial 0  . citalopram (CELEXA) 40 MG tablet     . clonazePAM (KLONOPIN) 0.5 MG tablet Take 1 tablet (0.5 mg total) by mouth 2 (two) times daily. 180 tablet 1  . ibuprofen (ADVIL,MOTRIN) 200 MG tablet Take 200 mg by mouth every 6 (six) hours as needed.    Julie Shelton. levothyroxine (SYNTHROID, LEVOTHROID) 75 MCG tablet      No current facility-administered medications for this visit.     PAST MEDICAL HISTORY: Past Medical History:  Diagnosis Date  . Allergic rhinitis   . Chronic pain   . Depression   . GERD (gastroesophageal reflux disease)   . Hypothyroidism   . Torticollis   . Vitamin D deficiency     PAST SURGICAL  HISTORY: Past Surgical History:  Procedure Laterality Date  . AUGMENTATION MAMMAPLASTY     Removed 5 years ago   . BREAST ENHANCEMENT SURGERY    . breast implants removed    . LAPAROSCOPIC TUBAL LIGATION    . NECK SURGERY    . SHOULDER OPEN ROTATOR CUFF REPAIR     2003    FAMILY HISTORY: Family History  Problem Relation Age of Onset  . Thyroid disease Mother   . Dementia Father     SOCIAL HISTORY:  Social History   Social History  . Marital status: Married    Spouse name: [name]  . Number of children: 2  . Years of education: 12   Occupational History  .  Retired    retired   Social History Main Topics  . Smoking status: Former Smoker    Types: Cigarettes  . Smokeless tobacco: Never Used     Comment: Quit in 1972  . Alcohol use 1.8 oz/week    3 Cans of beer per week     Comment: three at night  . Drug use: No  . Sexual activity: Not on file   Other Topics Concern  . Not on file   Social History Narrative   Patient lives at home with her husband Dorma Russell([name]) .   Patient is retired.    Education- High  school    Caffeine- two or three cups daily.     PHYSICAL EXAM   Vitals:   12/29/16 1459  BP: 138/80  Pulse: 72  Weight: 178 lb (80.7 kg)  Height:  (1.676 m)    Not recorded      Body mass index is 28.73 kg/m.  PHYSICAL EXAMNIATION:  Gen: NAD, conversant, well nourised, obese, well groomed                     Cardiovascular: Regular rate rhythm, no peripheral edema, warm, nontender. Eyes: Conjunctivae clear without exudates or hemorrhage Neck: Supple, no carotid bruits. Pulmonary: Clear to auscultation bilaterally   NEUROLOGICAL EXAM: She has moderate retrocollis, moderate right tilt, mild left shoulder elevation, severe limited range of motion looking to the right or left, mild sagittal anterior shift   MENTAL STATUS: Speech:    Speech is normal; fluent and spontaneous with normal comprehension.  Cognition:     Orientation to time,  place and person     Normal recent and remote memory     Normal Attention span and concentration     Normal Language, naming, repeating,spontaneous speech     Fund of knowledge   CRANIAL NERVES: CN II: Visual fields are full to confrontation. Fundoscopic exam is normal with sharp discs and no vascular changes. Pupils are round equal and briskly reactive to light. CN III, IV, VI: extraocular movement are normal. No ptosis. CN V: Facial sensation is intact to pinprick in all 3 divisions bilaterally. Corneal responses are intact.  CN VII: Face is symmetric with normal eye closure and smile. CN VIII: Hearing is normal to rubbing fingers CN IX, X: Palate elevates symmetrically. Phonation is normal. CN XI: Head turning and shoulder shrug are intact CN XII: Tongue is midline with normal movements and no atrophy.  MOTOR: There is no pronator drift of out-stretched arms. Muscle bulk and tone are normal. Muscle strength is normal.  REFLEXES: Reflexes are 2+ and symmetric at the biceps, triceps, knees, and ankles. Plantar responses are flexor.  SENSORY: Intact to light touch, pinprick, positional sensation and vibratory sensation are intact in fingers and toes.  COORDINATION: Rapid alternating movements and fine finger movements are intact. There is no dysmetria on finger-to-nose and heel-knee-shin.    GAIT/STANCE: Posture is normal. Gait is steady with normal steps, base, arm swing, and turning. Heel and toe walking are normal. Tandem gait is normal.  Romberg is absent.   DIAGNOSTIC DATA (LABS, IMAGING, TESTING) - I reviewed patient records, labs, notes, testing and imaging myself where available.   ASSESSMENT AND PLAN  ATLEY SCARBORO is a 76 y.o. female   Cervical dystonia  Moderate retrocollis, right tilt, mild to moderate right shoulder elevation, mild anterior sagittal shift  She has significant atrophy of left lateral posterior neck muscles,  We used BOTOX 300 units a day, (  100 units/2cc of normal saline).   Right rectus capitis posterior minor 25 units Right inferior oblique capitis 25 units Right splenius capitis 25 units Right semispinalis 25 units  Left sternocleidomastoid 50 units  Left semispinalis 25 units Left splenius capitis 25 unitsx2=50 units  Left longissimus capitis 25 x2=50units Left rectus capitis posterior minor 25 units Left rectus capitis posterior minor major 25 units Left inferior oblique capitis 25x2=50 units  Left splenius cervix 25  We are asked for BOTOX 400 units at next Injection   Levert Feinstein, M.D. Ph.D.  [facility] [address], [address] [place], [place] [address] Ph: [phone] Fax: [phone]  CC: Referring Provider

## 2017-03-05 ENCOUNTER — Other Ambulatory Visit: Payer: Self-pay | Admitting: Neurology

## 2017-03-07 ENCOUNTER — Telehealth: Payer: Self-pay | Admitting: Neurology

## 2017-03-07 NOTE — Telephone Encounter (Signed)
Julie BeltAriana with [facility] is calling to get a new Rx for Botox. She says there is a dosage change.

## 2017-03-14 NOTE — Telephone Encounter (Signed)
Noted. Will call closer to apt time.

## 2017-03-30 ENCOUNTER — Ambulatory Visit: Payer: Medicare Other | Admitting: Neurology

## 2017-04-25 ENCOUNTER — Telehealth: Payer: Self-pay | Admitting: Neurology

## 2017-04-25 NOTE — Telephone Encounter (Signed)
Pt calling to reschedule her Botox appointment, please call pt

## 2017-04-25 NOTE — Telephone Encounter (Signed)
I called and rescheduled the patient.  °

## 2017-04-27 ENCOUNTER — Ambulatory Visit: Payer: Medicare Other | Admitting: Neurology

## 2017-06-01 ENCOUNTER — Ambulatory Visit (INDEPENDENT_AMBULATORY_CARE_PROVIDER_SITE_OTHER): Payer: Medicare Other | Admitting: Neurology

## 2017-06-01 ENCOUNTER — Encounter: Payer: Self-pay | Admitting: Neurology

## 2017-06-01 ENCOUNTER — Telehealth: Payer: Self-pay | Admitting: Neurology

## 2017-06-01 VITALS — BP 131/90 | HR 73 | Ht 66.0 in

## 2017-06-01 DIAGNOSIS — G243 Spasmodic torticollis: Secondary | ICD-10-CM

## 2017-06-01 NOTE — Telephone Encounter (Signed)
Can we increase her botox A 100 units, 500 units at next injection

## 2017-06-01 NOTE — Progress Notes (Signed)
PATIENT: Julie Shelton DOB: 08-17-1940  Chief Complaint  Patient presents with  . Cervical Dystonia    Botox 100 units x 4 vials - specialty pharmacy     HISTORICAL  Julie Shelton, seen in refer by      HISTORICAL  Julie Shelton is a 77 years old right-handed Caucasian female, by herself, referred by her primary care physician Dr. 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  She had a past medical history of hypothyroidism, on supplement,  she was diagnosed with [facility] around [facility] at [facility]  Around 2004, wiithout clear trigger, she had gradual onset of head pulling to her left shoulder, difficulty turning her neck to either direction especially to her left, later she also developed shooting pain from her left cervical region down to her left shoulder, numbness involving left fifth finger  She [facility], which showed loss of height with left eccentric disc osteophyte complex at C5-6, and C6 and 7, with mild central canal narrowing, minimum left foraminal narrowing at C6 and 7, repeat MRI of cervical in 2007, a year later, continued to demonstrate spondylosis at C5-6, C6 and 7, with endplate osteophyte, covering bulging disc material, mild canal stenosis, no cord compression, there is no significant foraminal stenosis.  However with her persistent left-sided neck pain, shooting pain to her left shoulder, she underwent anterior cervical discectomy, and fusion at C5-6, C6 and 7 with tricortical autograft, and plating with a short statue synthetic system C5-C7 by Dr.Roy, Ranjan at [facility] in August 2008, this [place],  She only has minimum improvement of her neck pain postsurgically, eventually she was evaluated by [facility] in October 2008, was correctly diagnosed with spasmodic torticollis, has received repeat EMG guided Botox injection under supervision of Dr. Jeremy JohannJanice Massey, she  was receiving injections every 3-5 months, last injection was April 2,2014, she reported mild to moderate improvement with each injection, due to insurance change, she [facility]. She desires to transfer her injection locally.  She denied any significant side effect was Botox injection, was not sure about the dosage, but reported continued mild to moderate improvement with each injection, over the past few months, because prolonged interval with the last injection, she noticed worsening neck pain, difficulty turning her neck, and the worsening abnormal posture.  She is also taking clonazepam 0.5 mg twice a day, she has significant neck pain, 8 out of 10, which has put limitation on her daily activity,  She denies gait difficulty, no bilateral arm persistent sensory loss, all weakness,  I started to give her her  EMG guided Xeomin injection in Feb 11th 2015, no significant side effect noticed either.  UPDATE May 13th 2015:  There was no significant improvement following her first EMG guided Xeomin injection in Feb 11th 2015, no significant side effect noticed either. she continued to complains of neck hyperextension, cracking sound when moving her neck, gait difficulty,  We also reviewed MRI of the cervical spine,  which showed multilevel degenerative disc disease, there is no significant canal or foraminal stenosis.  She complains of left breast area tenderness, bloating sensation.  UPDATE Sep 2nd 2015: We did experimental right frontalis xeomin injection in last visit in May 2015, she did notice significant decreased frontalis movement, which has establish that xeomin should still be effective botulinum toxin for her, we did 300 mg of xeomin in  last injection May 2015. Reported only mild improvement in her left neck pain, and neck posturing, there was no significant side effect.  Update April 24 2014: She received 400 units of Xeomin injection  in September second 2015, she reported moderate improvement, but continue have moderate disabilitating left posterior neck pain, no significant side effect noticed  Update Aug 28 2014: Last injection in January 2016 works well for her, this time, insurance approved for Botox only, not as previously xeomin, 400 units,  Update Sep 02 2016 She has lost follow-up since last injection in May 2016, now she complains worsening neck pain, limited range of motion, hope to resume her injection  UPDATE September 22 2016: She came in for EMG guided BOTOX injection, we used 300 units  Today  UPDATE Jun 01 2017: She reported mild improvement with previous injection, complains of dizziness, fatigue, constant left-sided neck pain   REVIEW OF SYSTEMS: Full 14 system review of systems performed and notable only for  Leg swelling, murmur, activity change, swollen abdomen, abdominal pain, snoring, joint pain, neck pain, walking difficulty weakness dizziness headache depression anxiety  ALLERGIES: No Known Allergies  HOME MEDICATIONS: Current Outpatient Medications  Medication Sig Dispense Refill  . citalopram (CELEXA) 40 MG tablet     . clonazePAM (KLONOPIN) 0.5 MG tablet Take 1 tablet (0.5 mg total) by mouth 2 (two) times daily. 180 tablet 1  . ibuprofen (ADVIL,MOTRIN) 200 MG tablet Take 200 mg by mouth every 6 (six) hours as needed.    Marland Kitchen levothyroxine (SYNTHROID, LEVOTHROID) 75 MCG tablet     . OnabotulinumtoxinA (BOTOX IJ) Inject 400 Units as directed every 3 (three) months.     No current facility-administered medications for this visit.     PAST MEDICAL HISTORY: Past Medical History:  Diagnosis Date  . Allergic rhinitis   . Chronic pain   . Depression   . GERD (gastroesophageal reflux disease)   . Hypothyroidism   . Torticollis   . Vitamin D deficiency     PAST SURGICAL HISTORY: Past Surgical History:  Procedure Laterality Date  . AUGMENTATION MAMMAPLASTY     Removed 5 years ago   .  BREAST ENHANCEMENT SURGERY    . breast implants removed    . LAPAROSCOPIC TUBAL LIGATION    . NECK SURGERY    . SHOULDER OPEN ROTATOR CUFF REPAIR     2003    FAMILY HISTORY: Family History  Problem Relation Age of Onset  . Thyroid disease Mother   . Dementia Father     SOCIAL HISTORY:  Social History   Socioeconomic History  . Marital status: Married    Spouse name: Dorma Russell  . Number of children: 2  . Years of education: 74  . Highest education level: Not on file  Social Needs  . Financial resource strain: Not on file  . Food insecurity - worry: Not on file  . Food insecurity - inability: Not on file  . Transportation needs - medical: Not on file  . Transportation needs - non-medical: Not on file  Occupational History    Employer: RETIRED    Comment: retired  Tobacco Use  . Smoking status: Former Smoker    Types: Cigarettes  . Smokeless tobacco: Never Used  . Tobacco comment: Quit in 1972  Substance and Sexual Activity  . Alcohol use: Yes    Alcohol/week: 1.8 oz    Types: 3 Cans of beer per week    Comment: three at night  . Drug use:  No  . Sexual activity: Not on file  Other Topics Concern  . Not on file  Social History Narrative   Patient lives at home with her husband Dorma Russell) .   Patient is retired.    Education- High school    Caffeine- two or three cups daily.     PHYSICAL EXAM   Vitals:   06/01/17 1459  Height: 5\' 6"  (1.676 m)    Not recorded      Body mass index is 28.73 kg/m.  PHYSICAL EXAMNIATION:  Gen: NAD, conversant, well nourised, obese, well groomed                     Cardiovascular: Regular rate rhythm, no peripheral edema, warm, nontender. Eyes: Conjunctivae clear without exudates or hemorrhage Neck: Supple, no carotid bruits. Pulmonary: Clear to auscultation bilaterally   NEUROLOGICAL EXAM: She has moderate retrocollis, moderate right tilt, mild left shoulder elevation, severe limited range of motion looking to the right or  left, mild sagittal anterior shift   MENTAL STATUS: Speech:    Speech is normal; fluent and spontaneous with normal comprehension.  Cognition:     Orientation to time, place and person     Normal recent and remote memory     Normal Attention span and concentration     Normal Language, naming, repeating,spontaneous speech     Fund of knowledge   CRANIAL NERVES: CN II: Visual fields are full to confrontation. Fundoscopic exam is normal with sharp discs and no vascular changes. Pupils are round equal and briskly reactive to light. CN III, IV, VI: extraocular movement are normal. No ptosis. CN V: Facial sensation is intact to pinprick in all 3 divisions bilaterally. Corneal responses are intact.  CN VII: Face is symmetric with normal eye closure and smile. CN VIII: Hearing is normal to rubbing fingers CN IX, X: Palate elevates symmetrically. Phonation is normal. CN XI: Head turning and shoulder shrug are intact CN XII: Tongue is midline with normal movements and no atrophy.  MOTOR: There is no pronator drift of out-stretched arms. Muscle bulk and tone are normal. Muscle strength is normal.  REFLEXES: Reflexes are 2+ and symmetric at the biceps, triceps, knees, and ankles. Plantar responses are flexor.  SENSORY: Intact to light touch, pinprick, positional sensation and vibratory sensation are intact in fingers and toes.  COORDINATION: Rapid alternating movements and fine finger movements are intact. There is no dysmetria on finger-to-nose and heel-knee-shin.    GAIT/STANCE: Posture is normal. Gait is steady with normal steps, base, arm swing, and turning. Heel and toe walking are normal. Tandem gait is normal.  Romberg is absent.   DIAGNOSTIC DATA (LABS, IMAGING, TESTING) - I reviewed patient records, labs, notes, testing and imaging myself where available.   ASSESSMENT AND PLAN  Julie Shelton is a 77 y.o. female   Cervical dystonia  Moderate retrocollis, right tilt,   Moderate left turn, mild to moderate right shoulder elevation, mild anterior sagittal shift  She has significant atrophy of left lateral posterior neck muscles, milder degree of the right posterior and lateral neck muscles.  We used BOTOX 400 units a day, ( 100 units/2cc of normal saline).   Right rectus capitis posterior minor 25 units Right inferior oblique capitis 25 units Right splenius capitis 25 units Right semispinalis 25 units    Left semispinalis 25 units Left splenius capitis 25 unitsx2=50 units  Left longissimus capitis 25 x2=50units Left rectus capitis posterior minor 25 units Left rectus capitis  posterior minor major 25 units Left inferior oblique capitis 25x2=50 units  Left splenius cervix 25x3=75 units  We are asked for BOTOX 500 units at next Injection   Levert Feinstein, M.D. Ph.D.  [facility] [address], [address] [place], [place] [address] Ph: [phone] Fax: [phone]  CC: Referring Provider

## 2017-06-01 NOTE — Telephone Encounter (Signed)
Pt. Needs 12 week Botox inj.

## 2017-06-01 NOTE — Progress Notes (Signed)
**  Botox 100 units x 4 vials, NDC [id number], Lot [id number], Exp 11/2019, specialty pharmacy.//mck,rn**

## 2017-06-02 NOTE — Telephone Encounter (Signed)
I called to schedule the patients next injection but she did not answer. I left her a VM asking her to call me.

## 2017-06-02 NOTE — Telephone Encounter (Signed)
Noted  

## 2018-05-24 ENCOUNTER — Ambulatory Visit: Payer: Medicare Other | Admitting: Neurology

## 2018-05-24 ENCOUNTER — Encounter: Payer: Self-pay | Admitting: Neurology

## 2018-05-24 VITALS — BP 128/72 | HR 68 | Ht 66.0 in | Wt 188.0 lb

## 2018-05-24 DIAGNOSIS — G243 Spasmodic torticollis: Secondary | ICD-10-CM

## 2018-05-24 DIAGNOSIS — H903 Sensorineural hearing loss, bilateral: Secondary | ICD-10-CM | POA: Diagnosis not present

## 2018-05-24 DIAGNOSIS — H9190 Unspecified hearing loss, unspecified ear: Secondary | ICD-10-CM | POA: Diagnosis not present

## 2018-05-24 DIAGNOSIS — M542 Cervicalgia: Secondary | ICD-10-CM | POA: Diagnosis not present

## 2018-05-24 MED ORDER — DIAZEPAM 5 MG PO TABS: 5.0000 mg | ORAL_TABLET | Freq: Four times a day (QID) | ORAL | 0 refills | Status: DC | PRN

## 2018-05-24 NOTE — Progress Notes (Addendum)
PATIENT: Julie Shelton DOB: 07/26/1940  Chief Complaint  Patient presents with  . Cervical Dystonia    Last seen 06/01/17.  Her neck pain has worsened and she would like to discuss restarting Botox.   Marland Kitchen PCP    Marden Noble, MD     HISTORICAL  Julie Shelton is a 78 years old right-handed Caucasian female, by herself, referred by her primary care physician Dr. 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  She had a past medical history of hypothyroidism, on supplement,  she was diagnosed with cervical dystonia around 2008 at [facility]  Around 2004, wiithout clear trigger, she had gradual onset of head pulling to her left shoulder, difficulty turning her neck to either direction especially to her left, later she also developed shooting pain from her left cervical region down to her left shoulder, numbness involving left fifth finger  She had MRI of cervical in 2006 at [facility] imaging, which showed loss of height with left eccentric disc osteophyte complex at C5-6, and C6 and 7, with mild central canal narrowing, minimum left foraminal narrowing at C6 and 7, repeat MRI of cervical in 2007, a year later, continued to demonstrate spondylosis at C5-6, C6 and 7, with endplate osteophyte, covering bulging disc material, mild canal stenosis, no cord compression, there is no significant foraminal stenosis.  However with her persistent left-sided neck pain, shooting pain to her left shoulder, she underwent anterior cervical discectomy, and fusion at C5-6, C6 and 7 with tricortical autograft, and plating with a short statue synthetic system C5-C7 by Dr.Roy, Ranjan at [facility] in August 2008, this was done at [place], [place],  She only has minimum improvement of her neck pain postsurgically, eventually she was evaluated by [facility] in October 2008, was correctly diagnosed with spasmodic torticollis, has received repeat EMG guided Botox injection under supervision of Dr.  Jeremy Johann, she was receiving injections every 3-5 months, last injection was April 2,2014, she reported mild to moderate improvement with each injection, due to insurance change, she could no longer go to [facility] for continued treatment. She desires to transfer her injection locally.  She denied any significant side effect was Botox injection, was not sure about the dosage, but reported continued mild to moderate improvement with each injection, over the past few months, because prolonged interval with the last injection, she noticed worsening neck pain, difficulty turning her neck, and the worsening abnormal posture.  She is also taking clonazepam 0.5 mg twice a day, she has significant neck pain, 8 out of 10, which has put limitation on her daily activity,  She denies gait difficulty, no bilateral arm persistent sensory loss, all weakness,  I started to give her her  EMG guided Xeomin injection in Feb 11th 2015, no significant side effect noticed either.  UPDATE May 13th 2015:  There was no significant improvement following her first EMG guided Xeomin injection in Feb 11th 2015, no significant side effect noticed either. she continued to complains of neck hyperextension, cracking sound when moving her neck, gait difficulty,  We also reviewed MRI of the cervical spine,  which showed multilevel degenerative disc disease, there is no significant canal or foraminal stenosis.  She complains of left breast area tenderness, bloating sensation.  UPDATE Sep 2nd 2015: We did experimental right frontalis xeomin injection in last visit in May 2015, she did notice significant decreased frontalis movement, which has establish that xeomin should still be effective botulinum toxin for her, we did 300  mg of xeomin in last injection May 2015. Reported only mild improvement in her left neck pain, and neck posturing, there was no significant side effect.  Update April 24 2014: She received 400 units of  Xeomin injection in September second 2015, she reported moderate improvement, but continue have moderate disabilitating left posterior neck pain, no significant side effect noticed  Update Aug 28 2014: Last injection in January 2016 works well for her, this time, insurance approved for Botox only, not as previously xeomin, 400 units,  Update Sep 02 2016 She has lost follow-up since last injection in May 2016, now she complains worsening neck pain, limited range of motion, hope to resume her injection  UPDATE September 22 2016: She came in for EMG guided BOTOX injection, we used 300 units  Today  UPDATE Jun 01 2017: She reported mild improvement with previous injection, complains of dizziness, fatigue, constant left-sided neck pain  UPDATE May 24 2018: She has lost follow-up since February 2019, noticed worsening left-sided neck pain, constant neck turning towards the left side, mild radiating pain to left shoulder, denies significant gait abnormality no bowel and bladder incontinence,  She also recently noticed popping sounds at bilateral ear, was seen by ENT,  I was able to review her ENT evaluation on June 10, 2017, 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  Dix-Hallpike maneuver failed to demonstrate spontaneous nystagmus  Laboratory evaluations in January 2020, hemoglobin of 13.4,  REVIEW OF SYSTEMS: Full 14 system review of systems performed and notable only for  As above  All rest review of the system were negative ALLERGIES: No Known Allergies  HOME MEDICATIONS: Current Outpatient Medications  Medication Sig Dispense Refill  . citalopram (CELEXA) 40 MG tablet Take 40 mg by mouth daily.     . clonazePAM (KLONOPIN) 0.5 MG tablet Take 1 tablet (0.5 mg total) by mouth 2 (two) times daily. 180 tablet 1  . ibuprofen (ADVIL,MOTRIN) 200 MG tablet Take 200 mg by  mouth every 6 (six) hours as needed.    Marland Kitchen levothyroxine (SYNTHROID, LEVOTHROID) 75 MCG tablet      No current facility-administered medications for this visit.     PAST MEDICAL HISTORY: Past Medical History:  Diagnosis Date  . Allergic rhinitis   . Chronic pain   . Depression   . GERD (gastroesophageal reflux disease)   . Hypothyroidism   . Torticollis   . Vitamin D deficiency     PAST SURGICAL HISTORY: Past Surgical History:  Procedure Laterality Date  . AUGMENTATION MAMMAPLASTY     Removed 5 years ago   . BREAST ENHANCEMENT SURGERY    . breast implants removed    . LAPAROSCOPIC TUBAL LIGATION    . NECK SURGERY    . SHOULDER OPEN ROTATOR CUFF REPAIR     2003    FAMILY HISTORY: Family History  Problem Relation Age of Onset  . Thyroid disease Mother   . Dementia Father     SOCIAL HISTORY:  Social History   Socioeconomic History  . Marital status: Married    Spouse name: Julie Shelton  . Number of children: 2  . Years of education: 45  . Highest education level: Not on file  Occupational History    Employer: RETIRED    Comment: retired  Engineer, production  . Financial resource strain: Not on file  . Food insecurity:    Worry: Not on file  Inability: Not on file  . Transportation needs:    Medical: Not on file    Non-medical: Not on file  Tobacco Use  . Smoking status: Former Smoker    Types: Cigarettes  . Smokeless tobacco: Never Used  . Tobacco comment: Quit in 1972  Substance and Sexual Activity  . Alcohol use: Yes    Comment: social  . Drug use: No  . Sexual activity: Not on file  Lifestyle  . Physical activity:    Days per week: Not on file    Minutes per session: Not on file  . Stress: Not on file  Relationships  . Social connections:    Talks on phone: Not on file    Gets together: Not on file    Attends religious service: Not on file    Active member of club or organization: Not on file    Attends meetings of clubs or organizations: Not on file     Relationship status: Not on file  . Intimate partner violence:    Fear of current or ex partner: Not on file    Emotionally abused: Not on file    Physically abused: Not on file    Forced sexual activity: Not on file  Other Topics Concern  . Not on file  Social History Narrative   Patient lives at home with her husband Julie Shelton([name]) .   Patient is retired.    Education- High school    Caffeine- two cups daily.     PHYSICAL EXAM   Vitals:   05/24/18 0911  Weight: 188 lb (85.3 kg)  Height: 5\' 6"  (1.676 m)    Not recorded      Body mass index is 30.34 kg/m.  PHYSICAL EXAMNIATION:  Gen: NAD, conversant, well nourised, obese, well groomed                     Cardiovascular: Regular rate rhythm, no peripheral edema, warm, nontender. Eyes: Conjunctivae clear without exudates or hemorrhage Neck: Supple, no carotid bruits. Pulmonary: Clear to auscultation bilaterally   NEUROLOGICAL EXAM: She has moderate retrocollis, moderate right tilt, moderate left turn, slight left shoulder elevation, severe limited range of motion looking to the right or left, mild sagittal anterior shift   MENTAL STATUS: Speech:    Speech is normal; fluent and spontaneous with normal comprehension.  Cognition:     Orientation to time, place and person     Normal recent and remote memory     Normal Attention span and concentration     Normal Language, naming, repeating,spontaneous speech     Fund of knowledge   CRANIAL NERVES: CN II: Visual fields are full to confrontation. Fundoscopic exam is normal with sharp discs and no vascular changes. Pupils are round equal and briskly reactive to light. CN III, IV, VI: extraocular movement are normal. No ptosis. CN V: Facial sensation is intact to pinprick in all 3 divisions bilaterally. Corneal responses are intact.  CN VII: Face is symmetric with normal eye closure and smile. CN VIII: Hearing is normal to rubbing fingers CN IX, X: Palate elevates  symmetrically. Phonation is normal. CN XI: Head turning and shoulder shrug are intact CN XII: Tongue is midline with normal movements and no atrophy.  MOTOR: There is no pronator drift of out-stretched arms. Muscle bulk and tone are normal. Muscle strength is normal.  REFLEXES: Reflexes are 2+ and symmetric at the biceps, triceps, knees, and ankles. Plantar responses are flexor.  SENSORY: Intact  to light touch, pinprick, positional sensation and vibratory sensation are intact in fingers and toes.  COORDINATION: Rapid alternating movements and fine finger movements are intact. There is no dysmetria on finger-to-nose and heel-knee-shin.    GAIT/STANCE: Posture is normal. Gait is steady with normal steps, base, arm swing, and turning. Heel and toe walking are normal. Tandem gait is normal.  Romberg is absent.   DIAGNOSTIC DATA (LABS, IMAGING, TESTING) - I reviewed patient records, labs, notes, testing and imaging myself where available.   ASSESSMENT AND PLAN  Frederick Peersdna R Lyne is a 78 y.o. female   Cervical dystonia  Moderate retrocollis, right tilt,  moderate left turn, slight right shoulder elevation, mild anterior sagittal shift  She has significant upper atrophy of left lateral posterior neck muscles, milder degree of the right posterior and lateral neck muscles.  Last injection in February 2019, we used Botox a 400 units, reported suboptimal response to Xeomin in the past,  Preauthorization for Botox a  Refer her to physical therapy  Sensory neuronal hearing loss  MRI of the brain to rule out structural lesion   Levert FeinsteinYijun Anaria Kroner, M.D. Ph.D.  [facility] [address], [address] [place], [place] [address] Ph: [phone] Fax: [phone]  CC: Referring Provider

## 2018-05-25 ENCOUNTER — Telehealth: Payer: Self-pay | Admitting: Neurology

## 2018-05-25 NOTE — Telephone Encounter (Signed)
[employer] Medicare order sent to [facility]. No auth they will reach out to the pt to schedule.  °

## 2018-05-26 ENCOUNTER — Other Ambulatory Visit: Payer: Self-pay | Admitting: Internal Medicine

## 2018-05-26 DIAGNOSIS — Z13820 Encounter for screening for osteoporosis: Secondary | ICD-10-CM

## 2018-06-05 ENCOUNTER — Other Ambulatory Visit: Payer: Self-pay | Admitting: Internal Medicine

## 2018-06-05 DIAGNOSIS — Z1231 Encounter for screening mammogram for malignant neoplasm of breast: Secondary | ICD-10-CM

## 2018-06-13 ENCOUNTER — Telehealth: Payer: Self-pay | Admitting: Neurology

## 2018-06-13 NOTE — Telephone Encounter (Signed)
Pt is asking for a call from Danielle to schedule her Botox

## 2018-06-13 NOTE — Telephone Encounter (Signed)
Error

## 2018-06-15 ENCOUNTER — Encounter: Payer: Self-pay | Admitting: Cardiology

## 2018-06-15 ENCOUNTER — Ambulatory Visit: Payer: Medicare Other

## 2018-06-15 ENCOUNTER — Ambulatory Visit: Payer: Medicare Other | Admitting: Cardiology

## 2018-06-15 VITALS — BP 133/80 | HR 76 | Ht 69.0 in | Wt 190.0 lb

## 2018-06-15 DIAGNOSIS — R0609 Other forms of dyspnea: Secondary | ICD-10-CM

## 2018-06-15 DIAGNOSIS — R002 Palpitations: Secondary | ICD-10-CM

## 2018-06-15 DIAGNOSIS — R5383 Other fatigue: Secondary | ICD-10-CM

## 2018-06-15 NOTE — Progress Notes (Signed)
Patient referred by Josetta Huddle, MD for evaluation of fatigue.   Subjective:   Julie Shelton, female    DOB: 03-11-1941, 78 y.o.   MRN: [id number]   Chief Complaint  Patient presents with  . New Patient (Initial Visit)    pt c/o fatigue, sob, left arm pain    HPI  52 u/o Caucasian female with peripheral neuropathy, hypothyroidism, major depression, spasmodic torticolis, referred for evaluation of fatigue.   Patient is retired, lives with her husband. She worked for [employer] before. Her physical activity is limited due to neck torticolis. She seesNeurologist Dr. Mikeal Hawthorne for this and gets Botox injections once a year or so. She is soon going to have an MRI done for the same.    Patient has had exertional dyspnea for long time. She denies chest pain. She reports palpitations, unrelated to physical activity.She has had occasional indigestion symptom after meals, but denies any chest pain/pressure.   Labs 05/16/2018: Glucose 87.BUN/Cr 8/0.7. eGFR 80. Na/K 142/4.4 Rest of the CMP normal.  HH 13/40. MCV 100. Platelets 181. Chol 218, TG 89, HDL 59, LDL 141.  Vit B12 normal. TSH, T4 normal. Vit D3 normal.    Past Medical History:  Diagnosis Date  . Allergic rhinitis   . Chronic pain   . Depression   . GERD (gastroesophageal reflux disease)   . Hypothyroidism   . Torticollis   . Vitamin D deficiency      Past Surgical History:  Procedure Laterality Date  . AUGMENTATION MAMMAPLASTY     Removed 5 years ago   . BREAST ENHANCEMENT SURGERY    . breast implants removed    . LAPAROSCOPIC TUBAL LIGATION    . NECK SURGERY    . SHOULDER OPEN ROTATOR CUFF REPAIR     2003     Social History   Socioeconomic History  . Marital status: Married    Spouse name: Christean Grief  . Number of children: 2  . Years of education: 26  . Highest education level: Not on file  Occupational History    Employer: RETIRED    Comment: retired  Scientific laboratory technician  . Financial resource strain: Not on file    . Food insecurity:    Worry: Not on file    Inability: Not on file  . Transportation needs:    Medical: Not on file    Non-medical: Not on file  Tobacco Use  . Smoking status: Former Smoker    Types: Cigarettes  . Smokeless tobacco: Never Used  . Tobacco comment: Quit in 1972  Substance and Sexual Activity  . Alcohol use: Yes    Comment: social  . Drug use: No  . Sexual activity: Not on file  Lifestyle  . Physical activity:    Days per week: Not on file    Minutes per session: Not on file  . Stress: Not on file  Relationships  . Social connections:    Talks on phone: Not on file    Gets together: Not on file    Attends religious service: Not on file    Active member of club or organization: Not on file    Attends meetings of clubs or organizations: Not on file    Relationship status: Not on file  . Intimate partner violence:    Fear of current or ex partner: Not on file    Emotionally abused: Not on file    Physically abused: Not on file    Forced sexual activity:  Not on file  Other Topics Concern  . Not on file  Social History Narrative   Patient lives at home with her husband Christean Grief) .   Patient is retired.    Education- High school    Caffeine- two cups daily.     Current Outpatient Medications on File Prior to Visit  Medication Sig Dispense Refill  . cetirizine (ZYRTEC) 10 MG chewable tablet Chew 10 mg by mouth daily.    . citalopram (CELEXA) 40 MG tablet Take 20 mg by mouth daily.     . clonazePAM (KLONOPIN) 0.5 MG tablet Take 1 tablet (0.5 mg total) by mouth 2 (two) times daily. (Patient taking differently: Take 0.5 mg by mouth daily. ) 180 tablet 1  . diazepam (VALIUM) 5 MG tablet Take 1 tablet (5 mg total) by mouth every 6 (six) hours as needed for anxiety. Take 1-2 tablets 30 minutes prior to MRI, may repeat once as needed. Must have driver. 4 tablet 0  . ibuprofen (ADVIL,MOTRIN) 200 MG tablet Take 200 mg by mouth every 6 (six) hours as needed.    Marland Kitchen  levothyroxine (SYNTHROID, LEVOTHROID) 75 MCG tablet Take 75 mcg by mouth daily before breakfast.      No current facility-administered medications on file prior to visit.     Cardiovascular studies:   EKG 06/15/2018: Sinus rhythm 74 bpm. Incomplete RBBB  Review of Systems  Constitution: Negative for decreased appetite, malaise/fatigue, weight gain and weight loss.  HENT: Negative for congestion.   Eyes: Negative for visual disturbance.  Cardiovascular: Positive for dyspnea on exertion and palpitations. Negative for chest pain, leg swelling and syncope.  Respiratory: Negative for shortness of breath.   Endocrine: Negative for cold intolerance.  Hematologic/Lymphatic: Does not bruise/bleed easily.  Skin: Negative for itching and rash.  Musculoskeletal: Negative for myalgias.  Gastrointestinal: Negative for abdominal pain, nausea and vomiting.  Genitourinary: Negative for dysuria.  Neurological: Negative for dizziness and weakness.  Psychiatric/Behavioral: Positive for depression (Stable). The patient is not nervous/anxious.   All other systems reviewed and are negative.        Vitals:   06/15/18 1240  BP: 133/80  Pulse: 76  SpO2: 98%    Objective:   Physical Exam  Constitutional: She is oriented to person, place, and time. She appears well-developed and well-nourished. No distress.  HENT:  Head: Normocephalic and atraumatic.  Eyes: Pupils are equal, round, and reactive to light. Conjunctivae are normal.  Neck: No JVD present.  Cardiovascular: Normal rate, regular rhythm and intact distal pulses.  No murmur heard. Pulmonary/Chest: Effort normal and breath sounds normal. She has no wheezes. She has no rales.  Abdominal: Soft. Bowel sounds are normal. There is no rebound.  Musculoskeletal:        General: No edema.  Lymphadenopathy:    She has no cervical adenopathy.  Neurological: She is alert and oriented to person, place, and time. No cranial nerve deficit.    Torticolis  Skin: Skin is warm and dry.  Psychiatric: She has a normal mood and affect.  Nursing note and vitals reviewed.         Assessment & Recommendations:   75 u/o Caucasian female with peripheral neuropathy, hypothyroidism, major depression, spasmodic torticolis, referred for evaluation of fatigue.   1. Exertional dyspnea Euvolumic on exam. Recommend echocardiogram  2. Other fatigue Likely multifactorial. Los suspicion for angina. Monitor for now.   3. Palpitations Unremarkable EKG. Will place on event monitor  I will see her after the above  test.s  Thank you for referring the patient to [place]. Please feel free to contact with any questions.  Nigel Mormon, MD [facility] Pager: [phone] Office: [phone] If no answer Cell [phone]

## 2018-06-15 NOTE — Telephone Encounter (Signed)
I called the patient back to schedule apt, she did not answer so I left a VM asking her to return my call. I went ahead and made an apt. When she calls back please give her this apt info and make sure it works for her.

## 2018-06-17 ENCOUNTER — Encounter: Payer: Self-pay | Admitting: Cardiology

## 2018-06-17 DIAGNOSIS — R002 Palpitations: Secondary | ICD-10-CM | POA: Insufficient documentation

## 2018-06-17 DIAGNOSIS — R0609 Other forms of dyspnea: Secondary | ICD-10-CM

## 2018-06-17 DIAGNOSIS — R5383 Other fatigue: Secondary | ICD-10-CM | POA: Insufficient documentation

## 2018-06-20 ENCOUNTER — Other Ambulatory Visit: Payer: Self-pay

## 2018-06-20 ENCOUNTER — Ambulatory Visit: Payer: Medicare Other | Attending: Neurology

## 2018-06-20 DIAGNOSIS — M542 Cervicalgia: Secondary | ICD-10-CM

## 2018-06-20 DIAGNOSIS — M5412 Radiculopathy, cervical region: Secondary | ICD-10-CM | POA: Diagnosis present

## 2018-06-20 NOTE — Therapy (Signed)
[facility] [address], [place], [address] Phone: [phone]   Fax:  [phone]  Physical Therapy Evaluation  Patient Details  Name: Julie Shelton MRN: [id number] Date of Birth: 01/04/1941 Referring Provider (PT): Levert Feinstein, MD   Encounter Date: 06/20/2018  PT End of Session - 06/20/18 1521    Visit Number  1    Number of Visits  17    Date for PT Re-Evaluation  08/17/18    Authorization Type  1    Authorization Time Period  of 10 progress report (06/20/2018)    PT Start Time  1522    PT Stop Time  1618    PT Time Calculation (min)  56 min    Activity Tolerance  Patient tolerated treatment well    Behavior During Therapy  [facility] for tasks assessed/performed       Past Medical History:  Diagnosis Date  . Allergic rhinitis   . Chronic pain   . Depression   . GERD (gastroesophageal reflux disease)   . Hypothyroidism   . Torticollis   . Vitamin D deficiency     Past Surgical History:  Procedure Laterality Date  . AUGMENTATION MAMMAPLASTY     Removed 5 years ago   . BREAST ENHANCEMENT SURGERY    . breast implants removed    . LAPAROSCOPIC TUBAL LIGATION    . NECK SURGERY    . SHOULDER OPEN ROTATOR CUFF REPAIR     2003    There were no vitals filed for this visit.   Subjective Assessment - 06/20/18 1528    Subjective  Neck pain (predominanly L upper cervical spine around the greater occipital nerve area): 7/10 currently, 8/10 at worst.  L upper trap: 6/10 currently, 7/10 at worst; R upper trap area: not really hurting, but just stiff, 0/10 but stiff at worst.     Pertinent History  Cervicalgia, cervical dystonia. Pt currently wearing a heart monitor as well. Had cervical fusion about 10-12 years ago due to neck pain.  Had very minimal torticolis prior to her cervical surgery. However after her neck surgery, her torticolis became more prominent.  Did not have PT right after the neck surgery. Had  PT for her neck 10 years after the surgery.  Had PT for her neck a few years ago which helped.  Difficult to do her exercises at home.  Had soft tissue work on her neck with PT.  Pt feels tight muscles towards the L side of the back of her neck.  Pt states that her head keeps turning to her L.  The clonopine does not really help with her muscle spasms in her neck.  Feels like her neck is squishing together.     Patient Stated Goals  Want to be able to drive better (look around), be able to garden/yard work, clean her house.     Currently in Pain?  Yes    Pain Score  7     Pain Location  Neck    Pain Type  Chronic pain    Pain Onset  More than a month ago    Pain Frequency  Constant    Aggravating Factors   turning her head to the R, looking down, working on the computer.     Pain Relieving Factors  R S/L or supine position         [facility] PT Assessment - 06/20/18 1544      Assessment  Medical Diagnosis  cervical dystonia, cervicalgia    Referring Provider (PT)  Levert Feinstein, MD    Onset Date/Surgical Date  05/24/18   Date PT referral signed. Chronic condition   Hand Dominance  Right    Prior Therapy  Pt had PT for her neck before with positive results.       Precautions   Precaution Comments  hx of neck surgery      Restrictions   Other Position/Activity Restrictions  no known weight bearing restrictions      Balance Screen   Has the patient fallen in the past 6 months  No    Has the patient had a decrease in activity level because of a fear of falling?   No   pt states fear of falling   Is the patient reluctant to leave their home because of a fear of falling?   No   pt states fear of falling     Prior Function   Vocation  Retired    Leisure  work at her garden      Observation/Other Assessments   Observations  R sternocleidomastoid muscle tension      Posture/Postural Control   Posture Comments  R lower cervical side bend, L cervical rotation, upper cervical extension, L  shoulder lower, B protracted shoulders (L > R), anterior B scapular tipping, R iliac crest, greater trochanter, R knee higher than L.       AROM   Overall AROM Comments  15 degrees L cervical rotation, 10 degrees R cervical side bend posture    Cervical - Right Side Bend  25   10 degrees R cervical side bend posture   Cervical - Left Side Bend  -3   10 degrees R cervical side bend posture   Cervical - Right Rotation  0   15 degrees L cervical rotation posture   Cervical - Left Rotation  21   15 degrees L cervical rotation posture     Strength   Right Shoulder Flexion  4+/5    Right Shoulder ABduction  4/5    Left Shoulder Flexion  4+/5    Left Shoulder ABduction  4/5    Right Elbow Flexion  4/5    Right Elbow Extension  4+/5    Left Elbow Flexion  4/5    Left Elbow Extension  4+/5    Right Wrist Extension  4+/5    Left Wrist Extension  4/5      Palpation   Palpation comment  increased R sternocleidomastoid muscle, B upper trap muscle, cervical parasinal muscle tension.                 Objective measurements completed on examination: See above findings.    No latex band allergies   Manual therapy    Supine STM to posteror cervical muscles  Suboccipital release. No change in L upper cervical pain  Therapeutic exercise  Supine cervical nodding 10x2  Reviewed and given as part of her HEP. Pt demonstrated and verbalized understanding.     Improved exercise technique, movement at target joints, use of target muscles after mod verbal, visual, tactile cues.    Decreased R SCM muscle tension in supine position observed.  Tense B upper trap muscles, cervical paraspinal muscles, R SCM tension   Patient is a 78 year old female who came to physical therapy secondary to neck pain. She also presents with poor posture, limited cervical AROM, increased R sternocleidomastoid, cervical paraspinal muscle, bilateral upper  trap muscle tension; scapular weakness, and  difficulty performing movements such as looking around and difficulty performing tasks such as working at her computer. Patient will benefit from skilled physical therapy services to address the aforementioned deficits.   PT Education - 06/20/18 1819    Education Details  ther-ex, HEP, plan of care    Person(s) Educated  Patient    Methods  Explanation;Demonstration;Tactile cues;Verbal cues    Comprehension  Returned demonstration;Verbalized understanding              PT Short Term Goals - 06/20/18 1803      PT SHORT TERM GOAL #1   Title  Patient will be independent with her HEP to decrease pain, improve function.     Baseline  Pt has started her HEP (06/20/2018)    Time  3    Period  Weeks    Status  New    Target Date  07/13/18       PT Long Term Goals - 06/20/18 1803      PT LONG TERM GOAL #1   Title  Patient will have a decrease in neck pain to 4/10 or less at worst to promote ability to look around, perform chores, drive more comfortably.     Baseline  8/10 neck pain at worst (06/20/2018)    Time  8    Period  Weeks    Status  New    Target Date  08/17/18      PT LONG TERM GOAL #2   Title  Patient will have a decrease in L upper trap pain to 3/10 or less at worst to promote ability to perform functional tasks more comfortably.     Baseline  7/10 L upper trap area pain at worst (06/20/2018)    Time  8    Period  Weeks    Status  New    Target Date  08/17/18      PT LONG TERM GOAL #3   Title  Pt will improve cervical rotation by at least 10 degrees each direction to promote ability to look around more comfortably.     Baseline  Cervical rotation: 0 degrees R, 21 degrees L (06/20/2018)    Time  8    Period  Weeks    Status  New    Target Date  08/17/18               Plan - 06/20/18 1754    Clinical Impression Statement  Patient is a 78 year old female who came to physical therapy secondary to neck pain. She also presents with poor posture, limited cervical  AROM, increased R sternocleidomastoid, cervical paraspinal muscle, bilateral upper trap muscle tension; scapular weakness, and difficulty performing movements such as looking around and difficulty performing tasks such as working at her computer. Patient will benefit from skilled physical therapy services to address the aforementioned deficits.     Personal Factors and Comorbidities  Age;Comorbidity 1;Time since onset of injury/illness/exacerbation    Comorbidities  hx of neck surgery    Examination-Activity Limitations  Other   looking around   Examination-Participation Restrictions  Cleaning;Laundry;Driving;Yard Work    Conservation officer, historic buildings  Evolving/Moderate complexity   Pain seems to have worsened since last treatment   Clinical Decision Making  Moderate    Rehab Potential  Fair    PT Frequency  2x / week    PT Duration  8 weeks    PT Treatment/Interventions  Therapeutic exercise;Therapeutic activities;Neuromuscular re-education;Patient/family  education;Manual techniques;Dry needling;Aquatic Therapy;Electrical Stimulation;Iontophoresis 4mg /ml Dexamethasone    PT Next Visit Plan  gentle anterior cervical strengthening, scapular strengthening, stretching, manual techniques, modalities PRN    Consulted and Agree with Plan of Care  Patient       Patient will benefit from skilled therapeutic intervention in order to improve the following deficits and impairments:  Pain, Postural dysfunction, Improper body mechanics, Increased muscle spasms, Decreased strength, Decreased range of motion  Visit Diagnosis: Cervicalgia - Plan: PT plan of care cert/re-cert  Cervical radiculopathy - Plan: PT plan of care cert/re-cert     Problem List Patient Active Problem List   Diagnosis Date Noted  . Exertional dyspnea 06/17/2018  . Palpitations 06/17/2018  . Other fatigue 06/17/2018  . Hearing loss 05/24/2018  . 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 Cervicalgia 05/31/2013  . Torticollis   .  Depression   . GERD (gastroesophageal reflux disease)   . Hypothyroidism   . Allergic rhinitis   . Chronic pain   . Vitamin D deficiency     Loralyn FreshwaterMiguel  PT, DPT   06/20/2018, 6:27 PM  [facility] [address] [place], [place], [address] Phone: [phone]   Fax:  [phone]  Name: Julie Shelton MRN: [id number] Date of Birth: 1941-02-04

## 2018-06-20 NOTE — Patient Instructions (Signed)
Supine cervical nodding  Reviewed and given as part of her HEP 1 minute x 3. Pt demonstrated and verbalized understanding.

## 2018-06-22 ENCOUNTER — Ambulatory Visit: Payer: Medicare Other

## 2018-06-22 ENCOUNTER — Other Ambulatory Visit: Payer: Medicare Other

## 2018-06-27 ENCOUNTER — Ambulatory Visit: Payer: Medicare Other

## 2018-06-27 DIAGNOSIS — M5412 Radiculopathy, cervical region: Secondary | ICD-10-CM

## 2018-06-27 DIAGNOSIS — M542 Cervicalgia: Secondary | ICD-10-CM

## 2018-06-27 NOTE — Patient Instructions (Signed)
Medbridge Access Code: [email or web address]   Cervical retraction  10x3 with 5 second holds in supine  R cervical rotation   10x3

## 2018-06-27 NOTE — Therapy (Signed)
[place] [facility] [address], [place], [address] Phone: [phone]   Fax:  [phone]  Physical Therapy Treatment  Patient Details  Name: Julie Shelton MRN: [id number] Date of Birth: Aug 22, 1940 Referring Provider (PT): Levert Feinstein, MD   Encounter Date: 06/27/2018  PT End of Session - 06/27/18 1521    Visit Number  2    Number of Visits  17    Date for PT Re-Evaluation  08/17/18    Authorization Type  2    Authorization Time Period  of 10 progress report (06/20/2018)    PT Start Time  1521    PT Stop Time  1605    PT Time Calculation (min)  44 min    Activity Tolerance  Patient tolerated treatment well    Behavior During Therapy  [facility] for tasks assessed/performed       Past Medical History:  Diagnosis Date  . Allergic rhinitis   . Chronic pain   . Depression   . GERD (gastroesophageal reflux disease)   . Hypothyroidism   . Torticollis   . Vitamin D deficiency     Past Surgical History:  Procedure Laterality Date  . AUGMENTATION MAMMAPLASTY     Removed 5 years ago   . BREAST ENHANCEMENT SURGERY    . breast implants removed    . LAPAROSCOPIC TUBAL LIGATION    . NECK SURGERY    . SHOULDER OPEN ROTATOR CUFF REPAIR     2003    There were no vitals filed for this visit.  Subjective Assessment - 06/27/18 1523    Subjective  Pt states better able to do her HEP in the morning but can't at night due to stiffness. 8/10 L upper cervical pain currently.     Pertinent History  Cervicalgia, cervical dystonia. Pt currently wearing a heart monitor as well. Had cervical fusion about 10-12 years ago due to neck pain.  Had very minimal torticolis prior to her cervical surgery. However after her neck surgery, her torticolis became more prominent.  Did not have PT right after the neck surgery. Had PT for her neck 10 years after the surgery.  Had PT for her neck a few years ago which helped.  Difficult to do her  exercises at home.  Had soft tissue work on her neck with PT.  Pt feels tight muscles towards the L side of the back of her neck.  Pt states that her head keeps turning to her L.  The clonopine does not really help with her muscle spasms in her neck.  Feels like her neck is squishing together.     Patient Stated Goals  Want to be able to drive better (look around), be able to garden/yard work, clean her house.     Currently in Pain?  Yes    Pain Score  8     Pain Onset  More than a month ago                               PT Education - 06/27/18 1536    Education Details  ther-ex, HEP    Person(s) Educated  Patient    Methods  Explanation;Demonstration;Tactile cues;Verbal cues;Handout    Comprehension  Returned demonstration;Verbalized understanding         Objective   No latex band allergies    Medbridge Access Code: [email or web address]   Therapeutic exercise  Supine cervical nodding with PT assist for neutral rotation position 10x3  Supine manually resisted R cervical rotation isometrics in neutral 10x5 seconds for 3 sets  Supine manually resisted L lower cervical side bend isometrics at neutral 10x5 seconds for 2 sets  Supine chin tucks with R cervical rotation (to promote neutral position 10x3 with 5 seconds   Supine B scapular retraction 10x5 seconds for 3 sets  Standing B scapular retraction resisting red band 10x5 seconds for 3 sets  Seated L upper trap stretch 30 seconds x 2  Seated R cervical rotation 10x  Then with cervical flexion combination 10x  Increased discomfort but symptoms return to baseline quickly with rest suggesting stiffness.   Improved exercise technique, movement at target joints, use of target muscles after mod verbal, visual, tactile cues.   Response to treatment Slight discomfort with some of the exercises but symptoms return quickly to baseline at rest suggesting stiffness. Pt tolerated session well without aggravation of  symptoms.   Clinical impression Worked on strengthening muscles to counteract pull of the hypertonic muscles to promote better cervical posture, and decrease neck pain. Slight discomfort with some of the exercises but symptoms return quickly to baseline at rest suggesting stiffness. Pt tolerated session well without aggravation of symptoms. Pt will benefit from continued skilled physical therapy services to decrease pain, stiffness, improve ROM, and function.             PT Short Term Goals - 06/20/18 1803      PT SHORT TERM GOAL #1   Title  Patient will be independent with her HEP to decrease pain, improve function.     Baseline  Pt has started her HEP (06/20/2018)    Time  3    Period  Weeks    Status  New    Target Date  07/13/18        PT Long Term Goals - 06/20/18 1803      PT LONG TERM GOAL #1   Title  Patient will have a decrease in neck pain to 4/10 or less at worst to promote ability to look around, perform chores, drive more comfortably.     Baseline  8/10 neck pain at worst (06/20/2018)    Time  8    Period  Weeks    Status  New    Target Date  08/17/18      PT LONG TERM GOAL #2   Title  Patient will have a decrease in L upper trap pain to 3/10 or less at worst to promote ability to perform functional tasks more comfortably.     Baseline  7/10 L upper trap area pain at worst (06/20/2018)    Time  8    Period  Weeks    Status  New    Target Date  08/17/18      PT LONG TERM GOAL #3   Title  Pt will improve cervical rotation by at least 10 degrees each direction to promote ability to look around more comfortably.     Baseline  Cervical rotation: 0 degrees R, 21 degrees L (06/20/2018)    Time  8    Period  Weeks    Status  New    Target Date  08/17/18            Plan - 06/27/18 1817    Clinical Impression Statement  Worked on strengthening muscles to counteract pull of the hypertonic muscles to promote better cervical posture, and decrease  neck pain.  Slight discomfort with some of the exercises but symptoms return quickly to baseline at rest suggesting stiffness. Pt tolerated session well without aggravation of symptoms. Pt will benefit from continued skilled physical therapy services to decrease pain, stiffness, improve ROM, and function.     Personal Factors and Comorbidities  Age;Comorbidity 1;Time since onset of injury/illness/exacerbation    Comorbidities  hx of neck surgery    Examination-Activity Limitations  Other   looking around   Examination-Participation Restrictions  Cleaning;Laundry;Driving;Yard Work    Conservation officer, historic buildings  Evolving/Moderate complexity   Pain seems to have worsened since last treatment   Rehab Potential  Fair    PT Frequency  2x / week    PT Duration  8 weeks    PT Treatment/Interventions  Therapeutic exercise;Therapeutic activities;Neuromuscular re-education;Patient/family education;Manual techniques;Dry needling;Aquatic Therapy;Electrical Stimulation;Iontophoresis /ml Dexamethasone    PT Next Visit Plan  gentle anterior cervical strengthening, scapular strengthening, stretching, manual techniques, modalities PRN    Consulted and Agree with Plan of Care  Patient       Patient will benefit from skilled therapeutic intervention in order to improve the following deficits and impairments:  Pain, Postural dysfunction, Improper body mechanics, Increased muscle spasms, Decreased strength, Decreased range of motion  Visit Diagnosis: Cervicalgia  Cervical radiculopathy     Problem List Patient Active Problem List   Diagnosis Date Noted  . Exertional dyspnea 06/17/2018  . Palpitations 06/17/2018  . Other fatigue 06/17/2018  . Hearing loss 05/24/2018  . 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 Cervicalgia 05/31/2013  . Torticollis   . Depression   . GERD (gastroesophageal reflux disease)   . Hypothyroidism   . Allergic rhinitis   . Chronic pain   . Vitamin D deficiency     Loralyn Freshwater  PT, DPT   06/27/2018, 6:29 PM  [place] [facility] PHYSICAL AND SPORTS MEDICINE [address], [place], [address] Phone: [phone]   Fax:  [phone]  Name: Julie Shelton MRN: [id number] Date of Birth: 1940/06/15

## 2018-07-04 ENCOUNTER — Ambulatory Visit: Payer: Medicare Other

## 2018-07-06 ENCOUNTER — Ambulatory Visit: Payer: Medicare Other

## 2018-07-10 NOTE — Therapy (Signed)
[place] [facility] PH[facility]ORTS MEDICINE [address] [place], [address] Phone: [phone]   Fax:  [phone]  Patient Details  Name: Julie Shelton MRN: [id number] Date of Birth: [birth year] Referring Provider:  No ref. provider found  Encounter Date: 07/10/2018   Called patient and informed patient about current clinic closure for a minimum of 2 weeks due to the corona virus outbreak. Pt states the she (her neck) feels a little bit better. Not interested in Telehealth or an online PT program if one becomes available. Pt was informed that when the clinic re-opens, someone should be able to call her and schedule more appointments if needed.      Loralyn Freshwater PT, DPT   07/10/2018, 1:40 PM  [place] [facility] PHYSICAL AND SPORTS MEDICINE [address] [place], [address] Phone: [phone]   Fax:  [phone]

## 2018-07-11 ENCOUNTER — Ambulatory Visit: Payer: Medicare Other

## 2018-07-14 DIAGNOSIS — R002 Palpitations: Secondary | ICD-10-CM | POA: Diagnosis not present

## 2018-07-19 ENCOUNTER — Other Ambulatory Visit: Payer: Medicare Other

## 2018-07-21 ENCOUNTER — Other Ambulatory Visit: Payer: Medicare Other

## 2018-07-26 ENCOUNTER — Ambulatory Visit: Payer: Medicare Other | Admitting: Cardiology

## 2018-07-31 NOTE — Therapy (Signed)
[place] [facility] PH[facility] SPORTS MEDICINE [address], [place], [address] Phone: [phone]   Fax:  [phone]  Patient Details  Name: Julie Shelton MRN: [id number] Date of Birth: Jan 10, 1941 Referring Provider:  No ref. provider found  Encounter Date: 07/31/2018     The [facility] outpatient clinics are closed at this time due to the COVID-19 epidemic. The patient was contacted in regards to their therapy services. The patient is in agreement that they are safe and consent to being on hold for therapy services until the [facility] outpatient facilities reopen. At which time, the patient will be contacted to schedule an appointment to resume therapy services.   Pt states that the HEP provided (cervical retraction as well as R cervical rotation) helps. Pt was recommended to look at the mirror and try to move her head the opposite of her neck posture to help stretch tight muscles so long as her neck pain does not worsen. Pt verbalized understanding.   Pt states that she is currently not interested in Telehealth.      Loralyn Freshwater PT, DPT   07/31/2018, 4:16 PM  [place] [facility] SPORTS MEDICINE [address] [place], [address] Phone: [phone]   Fax:  [phone]

## 2018-08-16 ENCOUNTER — Other Ambulatory Visit: Payer: Medicare Other

## 2018-08-24 ENCOUNTER — Ambulatory Visit: Payer: Medicare Other | Admitting: Cardiology

## 2018-08-29 ENCOUNTER — Telehealth: Payer: Self-pay | Admitting: Neurology

## 2018-08-29 NOTE — Telephone Encounter (Signed)
Pt returned called to r/s her botox appt

## 2018-08-30 NOTE — Telephone Encounter (Signed)
I called the patient back but she did not answer. I left a VM asking her to return my call.  °

## 2018-08-31 ENCOUNTER — Ambulatory Visit: Payer: Medicare Other | Admitting: Neurology

## 2018-09-20 ENCOUNTER — Other Ambulatory Visit: Payer: Medicare Other

## 2019-02-05 ENCOUNTER — Emergency Department: Payer: Medicare Other

## 2019-02-05 ENCOUNTER — Encounter: Payer: Self-pay | Admitting: Emergency Medicine

## 2019-02-05 ENCOUNTER — Emergency Department
Admission: EM | Admit: 2019-02-05 | Discharge: 2019-02-05 | Disposition: A | Discharge status: Home / Self Care | Payer: Medicare Other | Attending: Emergency Medicine | Admitting: Emergency Medicine

## 2019-02-05 ENCOUNTER — Other Ambulatory Visit: Payer: Self-pay

## 2019-02-05 DIAGNOSIS — Z87891 Personal history of nicotine dependence: Secondary | ICD-10-CM | POA: Insufficient documentation

## 2019-02-05 DIAGNOSIS — Z79899 Other long term (current) drug therapy: Secondary | ICD-10-CM | POA: Insufficient documentation

## 2019-02-05 DIAGNOSIS — M79605 Pain in left leg: Secondary | ICD-10-CM | POA: Diagnosis present

## 2019-02-05 DIAGNOSIS — E039 Hypothyroidism, unspecified: Secondary | ICD-10-CM | POA: Insufficient documentation

## 2019-02-05 DIAGNOSIS — L03116 Cellulitis of left lower limb: Secondary | ICD-10-CM | POA: Insufficient documentation

## 2019-02-05 DIAGNOSIS — R42 Dizziness and giddiness: Secondary | ICD-10-CM | POA: Diagnosis not present

## 2019-02-05 LAB — URINALYSIS, COMPLETE (UACMP) WITH MICROSCOPIC
Bilirubin Urine: NEGATIVE
Glucose, UA: NEGATIVE mg/dL
Ketones, ur: 5 mg/dL — AB
Nitrite: NEGATIVE
Protein, ur: NEGATIVE mg/dL
Specific Gravity, Urine: 1.014 (ref 1.005–1.030)
pH: 5 (ref 5.0–8.0)

## 2019-02-05 LAB — CBC
HCT: 41.1 % (ref 36.0–46.0)
Hemoglobin: 13.5 g/dL (ref 12.0–15.0)
MCH: 32.5 pg (ref 26.0–34.0)
MCHC: 32.8 g/dL (ref 30.0–36.0)
MCV: 99 fL (ref 80.0–100.0)
Platelets: 186 10*3/uL (ref 150–400)
RBC: 4.15 MIL/uL (ref 3.87–5.11)
RDW: 13.1 % (ref 11.5–15.5)
WBC: 6.5 10*3/uL (ref 4.0–10.5)
nRBC: 0 % (ref 0.0–0.2)

## 2019-02-05 LAB — BASIC METABOLIC PANEL
Anion gap: 8 (ref 5–15)
BUN: 11 mg/dL (ref 8–23)
CO2: 25 mmol/L (ref 22–32)
Calcium: 9.2 mg/dL (ref 8.9–10.3)
Chloride: 103 mmol/L (ref 98–111)
Creatinine, Ser: 0.66 mg/dL (ref 0.44–1.00)
GFR calc Af Amer: >60 mL/min (ref >60)
GFR calc non Af Amer: >60 mL/min (ref >60)
Glucose, Bld: 96 mg/dL (ref 70–99)
Potassium: 3.5 mmol/L (ref 3.5–5.1)
Sodium: 136 mmol/L (ref 135–145)

## 2019-02-05 MED ORDER — CEPHALEXIN 500 MG PO CAPS: 500.0000 mg | ORAL_CAPSULE | Freq: Four times a day (QID) | ORAL | 0 refills | Status: AC

## 2019-02-05 MED ORDER — CEPHALEXIN 500 MG PO CAPS
500.0000 mg | ORAL_CAPSULE | Freq: Once | ORAL | Status: AC
  Administered 2019-02-05: 500 mg via ORAL
  Filled 2019-02-05: qty 1

## 2019-02-05 NOTE — ED Provider Notes (Signed)
[facility] Emergency Department Provider Note ____________________________________________   First MD Initiated Contact with Patient 02/05/19 1644     (approximate)  I have reviewed the triage vital signs and the nursing notes.   HISTORY  Chief Complaint Dizziness    HPI Julie Shelton is a 78 y.o. female with PMH as noted below who presents primarily for left leg pain, swelling, and redness.  The patient reports that she has had intermittent pain and mild swelling in the leg for a few months, but it became acutely more swollen and red over the last day.  She denies any pain radiating up the leg.  She has no weakness or numbness.  She denies fever or chills.  The patient also reports intermittent lightheadedness, nausea, and fatigue over the last week.   Past Medical History:  Diagnosis Date  . Allergic rhinitis   . Chronic pain   . Depression   . GERD (gastroesophageal reflux disease)   . Hypothyroidism   . Torticollis   . Vitamin D deficiency     Patient Active Problem List   Diagnosis Date Noted  . Exertional dyspnea 06/17/2018  . Palpitations 06/17/2018  . Other fatigue 06/17/2018  . Hearing loss 05/24/2018  . 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 Cervicalgia 05/31/2013  . Torticollis   . Depression   . GERD (gastroesophageal reflux disease)   . Hypothyroidism   . Allergic rhinitis   . Chronic pain   . Vitamin D deficiency     Past Surgical History:  Procedure Laterality Date  . AUGMENTATION MAMMAPLASTY     Removed 5 years ago   . BREAST ENHANCEMENT SURGERY    . breast implants removed    . LAPAROSCOPIC TUBAL LIGATION    . NECK SURGERY    . SHOULDER OPEN ROTATOR CUFF REPAIR     2003    Prior to Admission medications   Medication Sig Start Date End Date Taking? Authorizing Provider  cephALEXin (KEFLEX) 500 MG capsule Take 1 capsule (500 mg total) by mouth 4 (four) times daily for 7 days. 02/05/19 02/12/19  Dionne BucySiadecki, Mounir Skipper, MD   cetirizine (ZYRTEC) 10 MG chewable tablet Chew 10 mg by mouth daily.    [provider]  citalopram (CELEXA) 40 MG tablet Take 20 mg by mouth daily.  07/30/14   [provider]  clonazePAM (KLONOPIN) 0.5 MG tablet Take 1 tablet (0.5 mg total) by mouth 2 (two) times daily. Patient taking differently: Take 0.5 mg by mouth daily.  03/18/14   Levert FeinsteinYan, Yijun, MD  diazepam (VALIUM) 5 MG tablet Take 1 tablet (5 mg total) by mouth every 6 (six) hours as needed for anxiety. Take 1-2 tablets 30 minutes prior to MRI, may repeat once as needed. Must have driver. Patient not taking: Reported on 06/20/2018 05/24/18   Levert FeinsteinYan, Yijun, MD  ibuprofen (ADVIL,MOTRIN) 200 MG tablet Take 200 mg by mouth every 6 (six) hours as needed.    [provider]  levothyroxine (SYNTHROID, LEVOTHROID) 75 MCG tablet Take 75 mcg by mouth daily before breakfast.  07/29/14   [provider]  loratadine (CLARITIN) 10 MG tablet Take 10 mg by mouth daily as needed for allergies.    [provider]    Allergies Patient has no known allergies.  Family History  Problem Relation Age of Onset  . Thyroid disease Mother   . Dementia Father     Social History Social History   Tobacco Use  . Smoking status: Former Smoker  Types: Cigarettes  . Smokeless tobacco: Never Used  . Tobacco comment: Quit in 1972  Substance Use Topics  . Alcohol use: Yes    Comment: social  . Drug use: No    Review of Systems  Constitutional: Positive for resolved fever. Eyes: No redness. ENT: No sore throat. Cardiovascular: Denies chest pain. Respiratory: Denies shortness of breath. Gastrointestinal: Positive for nausea.  No vomiting or diarrhea Genitourinary: Negative for dysuria.  Musculoskeletal: Negative for back pain. Skin: Negative for rash. Neurological: Negative for headaches, focal weakness or numbness.   ____________________________________________   PHYSICAL EXAM:  VITAL SIGNS: ED Triage  Vitals  Enc Vitals Group     BP 02/05/19 1602 131/74     Pulse Rate 02/05/19 1602 78     Resp 02/05/19 1602 18     Temp 02/05/19 1602 98.1 F (36.7 C)     Temp Source 02/05/19 1602 Oral     SpO2 02/05/19 1602 98 %     Weight 02/05/19 1603 178 lb (80.7 kg)     Height 02/05/19 1603 5\' 9"  (1.753 m)     Head Circumference --      Peak Flow --      Pain Score 02/05/19 1603 10     Pain Loc --      Pain Edu? --      Excl. in GC? --     Constitutional: Alert and oriented.  Relatively well appearing and in no acute distress. Eyes: Conjunctivae are normal.  Head: Atraumatic. Nose: No congestion/rhinnorhea. Mouth/Throat: Mucous membranes are moist.   Neck: Normal range of motion.  Cardiovascular: Normal rate, regular rhythm. Good peripheral circulation. Respiratory: Normal respiratory effort.  No retractions.  Gastrointestinal:  No distention.  Musculoskeletal: Extremities warm and well perfused.  Left lower extremity with mild swelling distal to the knee.  Area of erythema, warmth, and induration to the middle and distal third of the lower leg.  No fluctuance.  No significant tenderness. Neurologic:  Normal speech and language.  5/5 motor strength and intact sensation to all extremities.   Skin:  Skin is warm and dry. No rash noted. Psychiatric: Mood and affect are normal. Speech and behavior are normal.  ____________________________________________   LABS (all labs ordered are listed, but only abnormal results are displayed)  Labs Reviewed  URINALYSIS, COMPLETE (UACMP) WITH MICROSCOPIC - Abnormal; Notable for the following components:      Result Value   Color, Urine YELLOW (*)    APPearance CLEAR (*)    Hgb urine dipstick MODERATE (*)    Ketones, ur 5 (*)    Leukocytes,Ua SMALL (*)    Bacteria, UA RARE (*)    All other components within normal limits  BASIC METABOLIC PANEL  CBC  CBG MONITORING, ED   ____________________________________________  EKG  ED ECG REPORT I,  02/07/19, the attending physician, personally viewed and interpreted this ECG.  Date: 02/05/2019 EKG Time: 1559 Rate: 78 Rhythm: normal sinus rhythm QRS Axis: normal Intervals: normal ST/T Wave abnormalities: normal Narrative Interpretation: no evidence of acute ischemia  ____________________________________________  RADIOLOGY  02/07/2019 venous LLE: No evidence of acute DVT.  Partially close Baker's cyst.  ____________________________________________   PROCEDURES  Procedure(s) performed: No  Procedures  Critical Care performed: No ____________________________________________   INITIAL IMPRESSION / ASSESSMENT AND PLAN / ED COURSE  Pertinent labs & imaging results that were available during my care of the patient were reviewed by me and considered in my medical decision making (see chart for  details).  78 year old female with PMH as noted above presents with intermittent pain to the left lower leg over the last few months, acutely worsened over the last day and associated with swelling and redness.  In addition the patient reports some nonspecific symptoms such as nausea and subjective fever in the last week.  On exam the patient is very well-appearing and her vital signs are normal.  The physical exam is unremarkable except for an area of erythema, induration, and warmth to the left lower leg and mild swelling to the distal left leg.  Overall presentation is most consistent with cellulitis.  Differential also includes DVT although this is less likely.  We will obtain basic labs to evaluate for other etiologies of the patient's intermittent lightheadedness and nausea, a urinalysis, and ultrasound of the left lower extremity.  ----------------------------------------- 7:03 PM on 02/05/2019 -----------------------------------------  Ultrasound shows no evidence of acute DVT.  The lab work-up is unremarkable.  Overall presentation is consistent with cellulitis.  I will  treat with Keflex as there is no evidence of purulence or MRSA risk factors.  I discussed the results of the work-up with the patient and the plan of care.  Return precautions given, and she expresses understanding.  She is stable for discharge at this time.  ____________________________________________   FINAL CLINICAL IMPRESSION(S) / ED DIAGNOSES  Final diagnoses:  Cellulitis of left lower extremity      NEW MEDICATIONS STARTED DURING THIS VISIT:  New Prescriptions   CEPHALEXIN (KEFLEX) 500 MG CAPSULE    Take 1 capsule (500 mg total) by mouth 4 (four) times daily for 7 days.     Note:  This document was prepared using Dragon voice recognition software and may include unintentional dictation errors.   Arta Silence, MD 02/05/19 1904

## 2019-02-05 NOTE — Discharge Instructions (Signed)
Take the antibiotic as prescribed and finish the full 7-day course.  Return to the ER for new, worsening, or persistent redness or swelling, pain or redness going up the leg, high fevers, weakness, vomiting, severe headache, or any other new or worsening symptoms that concern you.

## 2019-02-05 NOTE — ED Triage Notes (Signed)
Pt presents with lightheadedness x 3-4 months with headache intermittently, as well as leg pain all summer as well, with increased swelling today. Pt states she was seeing a neurologist and was supposed to have a brain scan in March but then it was postponed due to covid. For last 3-4 days, she has been nauseated and has had fever that apparently broke yesterday. Pt came to hospital today due to the leg pain, but wants to be seen for all the above.

## 2019-05-25 ENCOUNTER — Other Ambulatory Visit: Payer: Self-pay | Admitting: Internal Medicine

## 2019-05-25 DIAGNOSIS — Z13820 Encounter for screening for osteoporosis: Secondary | ICD-10-CM

## 2019-05-25 DIAGNOSIS — Z1231 Encounter for screening mammogram for malignant neoplasm of breast: Secondary | ICD-10-CM

## 2019-08-07 ENCOUNTER — Other Ambulatory Visit: Payer: Self-pay | Admitting: Internal Medicine

## 2019-08-07 DIAGNOSIS — Z13820 Encounter for screening for osteoporosis: Secondary | ICD-10-CM

## 2019-08-07 DIAGNOSIS — M85851 Other specified disorders of bone density and structure, right thigh: Secondary | ICD-10-CM

## 2019-08-09 ENCOUNTER — Ambulatory Visit
Admission: RE | Admit: 2019-08-09 | Discharge: 2019-08-09 | Disposition: A | Discharge status: Home / Self Care | Payer: Medicare Other | Source: Ambulatory Visit | Attending: Internal Medicine | Admitting: Internal Medicine

## 2019-08-09 ENCOUNTER — Other Ambulatory Visit: Payer: Self-pay

## 2019-08-09 DIAGNOSIS — Z13820 Encounter for screening for osteoporosis: Secondary | ICD-10-CM

## 2019-08-09 DIAGNOSIS — M85851 Other specified disorders of bone density and structure, right thigh: Secondary | ICD-10-CM

## 2019-08-09 DIAGNOSIS — Z1231 Encounter for screening mammogram for malignant neoplasm of breast: Secondary | ICD-10-CM

## 2019-10-02 ENCOUNTER — Ambulatory Visit: Payer: Medicare Other | Admitting: Neurology

## 2019-11-12 ENCOUNTER — Other Ambulatory Visit: Payer: Self-pay

## 2019-11-12 ENCOUNTER — Telehealth: Payer: Self-pay | Admitting: Neurology

## 2019-11-12 ENCOUNTER — Encounter: Payer: Self-pay | Admitting: Neurology

## 2019-11-12 ENCOUNTER — Ambulatory Visit: Payer: Medicare Other | Admitting: Neurology

## 2019-11-12 VITALS — BP 142/80 | HR 87 | Ht 69.0 in | Wt 179.0 lb

## 2019-11-12 DIAGNOSIS — R42 Dizziness and giddiness: Secondary | ICD-10-CM | POA: Insufficient documentation

## 2019-11-12 DIAGNOSIS — G243 Spasmodic torticollis: Secondary | ICD-10-CM

## 2019-11-12 DIAGNOSIS — H905 Unspecified sensorineural hearing loss: Secondary | ICD-10-CM

## 2019-11-12 NOTE — Progress Notes (Signed)
PATIENT: Julie Shelton DOB: 1940-09-03  Chief Complaint  Patient presents with  . Follow-up    Rm 4 alone here to f/u on Cervical Dystonia per Dr. Kevan NyGates reccomendation     HISTORICAL  Julie Shelton is a 79 years old right-handed Caucasian female, by herself, referred by her primary care physician Dr. 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  She had a past medical history of hypothyroidism, on supplement,  she was diagnosed with [facility] around 2008 at [facility]  Around 2004, wiithout clear trigger, she had gradual onset of head pulling to her left shoulder, difficulty turning her neck to either direction especially to her left, later she also developed shooting pain from her left cervical region down to her left shoulder, numbness involving left fifth finger  She had MRI of cervical in 2006 at [facility], which showed loss of height with left eccentric disc osteophyte complex at C5-6, and C6 and 7, with mild central canal narrowing, minimum left foraminal narrowing at C6 and 7, repeat MRI of cervical in 2007, a year later, continued to demonstrate spondylosis at C5-6, C6 and 7, with endplate osteophyte, covering bulging disc material, mild canal stenosis, no cord compression, there is no significant foraminal stenosis.  However with her persistent left-sided neck pain, shooting pain to her left shoulder, she underwent anterior cervical discectomy, and fusion at C5-6, C6 and 7 with tricortical autograft, and plating with a short statue synthetic system C5-C7 by Dr.Roy, Ranjan at [facility] in August 2008, this [place],  She only has minimum improvement of her neck pain postsurgically, eventually she was evaluated by [facility] in October 2008, was correctly diagnosed with spasmodic torticollis, has received repeat EMG guided Botox injection under supervision of Dr. Jeremy JohannJanice Massey, she was receiving injections every 3-5 months,  last injection was April 2,2014, she reported mild to moderate improvement with each injection, due to insurance change, she could no longer go to [facility]. She desires to transfer her injection locally.  She denied any significant side effect was Botox injection, was not sure about the dosage, but reported continued mild to moderate improvement with each injection, over the past few months, because prolonged interval with the last injection, she noticed worsening neck pain, difficulty turning her neck, and the worsening abnormal posture.  She is also taking clonazepam 0.5 mg twice a day, she has significant neck pain, 8 out of 10, which has put limitation on her daily activity,  She denies gait difficulty, no bilateral arm persistent sensory loss, all weakness,  I started to give her her  EMG guided Xeomin injection in Feb 11th 2015, no significant side effect noticed either.  UPDATE May 13th 2015:  There was no significant improvement following her first EMG guided Xeomin injection in Feb 11th 2015, no significant side effect noticed either. she continued to complains of neck hyperextension, cracking sound when moving her neck, gait difficulty,  We also reviewed MRI of the cervical spine,  which showed multilevel degenerative disc disease, there is no significant canal or foraminal stenosis.  She complains of left breast area tenderness, bloating sensation.  UPDATE Sep 2nd 2015: We did experimental right frontalis xeomin injection in last visit in May 2015, she did notice significant decreased frontalis movement, which has establish that xeomin should still be effective botulinum toxin for her, we did 300 mg of xeomin in last injection May 2015. Reported only mild improvement in her left  neck pain, and neck posturing, there was no significant side effect.  Update April 24 2014: She received 400 units of Xeomin injection in September second 2015, she reported  moderate improvement, but continue have moderate disabilitating left posterior neck pain, no significant side effect noticed  Update Aug 28 2014: Last injection in January 2016 works well for her, this time, insurance approved for Botox only, not as previously xeomin, 400 units,  Update Sep 02 2016 She has lost follow-up since last injection in May 2016, now she complains worsening neck pain, limited range of motion, hope to resume her injection  UPDATE September 22 2016: She came in for EMG guided BOTOX injection, we used 300 units  Today  UPDATE Jun 01 2017: She reported mild improvement with previous injection, complains of dizziness, fatigue, constant left-sided neck pain  UPDATE May 24 2018: She has lost follow-up since February 2019, noticed worsening left-sided neck pain, constant neck turning towards the left side, mild radiating pain to left shoulder, denies significant gait abnormality no bowel and bladder incontinence,  She also recently noticed popping sounds at bilateral ear, was seen by ENT,  I was able to review her ENT evaluation on June 10, 2017, 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  Dix-Hallpike maneuver failed to demonstrate spontaneous nystagmus  Laboratory evaluations in January 2020, hemoglobin of 13.4,  UPDATE November 12 2019:  She lost follow-up due to pandemic, complains of worsening left-sided neck tension, radiating neck pain from left occipital area to left temporoparietal, tightness of her neck muscles,  Continue complains of left hearing loss, she did not go for previously ordered MRI of the brain with without contrast  Today she also complains of intermittent dizziness, especially with neck turning, transient movement of going to pass out, she also suffered left lower extremity cellulitis in October 2020, now had a persistent left  lower extremity swelling, and numbness of left lateral 3 toes,  REVIEW OF SYSTEMS: Full 14 system review of systems performed and notable only for  As above  All rest review of the system were negative ALLERGIES: No Known Allergies  HOME MEDICATIONS: Current Outpatient Medications  Medication Sig Dispense Refill  . cetirizine (ZYRTEC) 10 MG chewable tablet Chew 10 mg by mouth as needed.     Marland Kitchen escitalopram (LEXAPRO) 10 MG tablet Take 10 mg by mouth daily.    Marland Kitchen ibuprofen (ADVIL,MOTRIN) 200 MG tablet Take 200 mg by mouth every 6 (six) hours as needed.    Marland Kitchen levothyroxine (SYNTHROID, LEVOTHROID) 75 MCG tablet Take 75 mcg by mouth daily before breakfast.      No current facility-administered medications for this visit.    PAST MEDICAL HISTORY: Past Medical History:  Diagnosis Date  . Allergic rhinitis   . Chronic pain   . Depression   . GERD (gastroesophageal reflux disease)   . Hypothyroidism   . Torticollis   . Vitamin D deficiency     PAST SURGICAL HISTORY: Past Surgical History:  Procedure Laterality Date  . AUGMENTATION MAMMAPLASTY     Removed 5 years ago   . BREAST ENHANCEMENT SURGERY    . breast implants removed    . LAPAROSCOPIC TUBAL LIGATION    . NECK SURGERY    . SHOULDER OPEN ROTATOR CUFF REPAIR     2003    FAMILY HISTORY: Family History  Problem Relation Age of Onset  . Thyroid disease Mother   .  Dementia Father     SOCIAL HISTORY:  Social History   Socioeconomic History  . Marital status: Married    Spouse name: Dorma Russell  . Number of children: 2  . Years of education: 16  . Highest education level: Not on file  Occupational History    Employer: RETIRED    Comment: retired  Tobacco Use  . Smoking status: Former Smoker    Types: Cigarettes  . Smokeless tobacco: Never Used  . Tobacco comment: Quit in 1972  Vaping Use  . Vaping Use: Never used  Substance and Sexual Activity  . Alcohol use: Yes    Comment: social  . Drug use: No  . Sexual  activity: Not on file  Other Topics Concern  . Not on file  Social History Narrative   Patient lives at home with her husband Dorma Russell) .   Patient is retired.    Education- High school    Caffeine- two cups daily.   Social Determinants of Health   Financial Resource Strain:   . Difficulty of Paying Living Expenses:   Food Insecurity:   . Worried About Programme researcher, broadcasting/film/video in the Last Year:   . Barista in the Last Year:   Transportation Needs:   . Freight forwarder (Medical):   Marland Kitchen Lack of Transportation (Non-Medical):   Physical Activity:   . Days of Exercise per Week:   . Minutes of Exercise per Session:   Stress:   . Feeling of Stress :   Social Connections:   . Frequency of Communication with Friends and Family:   . Frequency of Social Gatherings with Friends and Family:   . Attends Religious Services:   . Active Member of Clubs or Organizations:   . Attends Banker Meetings:   Marland Kitchen Marital Status:   Intimate Partner Violence:   . Fear of Current or Ex-Partner:   . Emotionally Abused:   Marland Kitchen Physically Abused:   . Sexually Abused:      PHYSICAL EXAM   Vitals:   11/12/19 1252  BP: (!) 142/80  Pulse: 87  SpO2: 96%  Weight: 179 lb (81.2 kg)  Height: 5\' 9"  (1.753 m)   Not recorded     Body mass index is 26.43 kg/m.  PHYSICAL EXAMNIATION:  Gen: NAD, conversant, well nourised, well groomed                     Cardiovascular: Regular rate rhythm, no peripheral edema, warm, nontender. Eyes: Conjunctivae clear without exudates or hemorrhage Neck: Supple, no carotid bruits. Pulmonary: Clear to auscultation bilaterally   NEUROLOGICAL EXAM:  NEUROLOGICAL EXAM: She has moderate retrocollis, moderate right tilt, moderate left turn, slight left shoulder elevation, severe limited range of motion looking to the right or left, mild sagittal anterior shift  MENTAL STATUS: Speech/Cognition: Awake, alert, normal speech, oriented to history taking  and casual conversation.  CRANIAL NERVES: CN II: Visual fields are full to confrontation.  Pupils are round equal and briskly reactive to light. CN III, IV, VI: extraocular movement are normal. No ptosis. CN V: Facial sensation is intact to light touch. CN VII: Face is symmetric with normal eye closure and smile. CN VIII: Hearing is normal to casual conversation CN IX, X: Palate elevates symmetrically. Phonation is normal. CN XI: Head turning and shoulder shrug are intact CN XII: Tongue is midline with normal movements and no atrophy.  MOTOR: Muscle bulk and tone are normal. Muscle strength is normal.  REFLEXES: Reflexes are 2  and symmetric at the biceps, triceps, knees and ankles. Plantar responses are flexor.  SENSORY: Intact to light touch, pinprick, positional and vibratory sensation at fingers and toes.  COORDINATION: There is no trunk or limb ataxia.    GAIT/STANCE: Posture is normal. Gait is steady with normal steps, base, arm swing and turning.    MENTAL STATUS: Speech:    Speech is normal; fluent and spontaneous with normal comprehension.  Cognition:     Orientation to time, place and person     Normal recent and remote memory     Normal Attention span and concentration     Normal Language, naming, repeating,spontaneous speech     Fund of knowledge   CRANIAL NERVES: CN II: Visual fields are full to confrontation. Fundoscopic exam is normal with sharp discs and no vascular changes. Pupils are round equal and briskly reactive to light. CN III, IV, VI: extraocular movement are normal. No ptosis. CN V: Facial sensation is intact to pinprick in all 3 divisions bilaterally. Corneal responses are intact.  CN VII: Face is symmetric with normal eye closure and smile. CN VIII: Hearing is normal to rubbing fingers CN IX, X: Palate elevates symmetrically. Phonation is normal. CN XI: Head turning and shoulder shrug are intact CN XII: Tongue is midline with normal movements  and no atrophy.  MOTOR: There is no pronator drift of out-stretched arms. Muscle bulk and tone are normal. Muscle strength is normal.  REFLEXES: Reflexes are 2+ and symmetric at the biceps, triceps, knees, and ankles. Plantar responses are flexor.  SENSORY: Intact to light touch, pinprick, positional sensation and vibratory sensation are intact in fingers and toes.  COORDINATION: Rapid alternating movements and fine finger movements are intact. There is no dysmetria on finger-to-nose and heel-knee-shin.    GAIT/STANCE: Please push-up to get up from seated position, forceful head turning towards the left side, right tilt, mild retrocollis, constant neck titubation.  Romberg is absent.   DIAGNOSTIC DATA (LABS, IMAGING, TESTING) - I reviewed patient records, labs, notes, testing and imaging myself where available.   ASSESSMENT AND PLAN  Julie Shelton is a 79 y.o. female   Cervical dystonia  Moderate retrocollis, right tilt,  moderate left turn, slight right shoulder elevation, mild anterior sagittal shift, difficulty and limited turing to right  She has significant upper atrophy of left lateral posterior neck muscles, milder degree of the right posterior and lateral neck muscles.  Last injection in February 2019, we used Botox a 400 units, reported suboptimal response to Xeomin in the past,  Preauthorization for Botox a  Dizziness, fainting sensation when turing,  MRA of neck and brain to rule out posterior circulation insufficiency  Sensory neuronal hearing loss  MRI of the brain w/wo  to rule out structural lesion    Levert Feinstein, M.D. Ph.D.  [facility] [address] [place], [place] [address] Ph: [phone] Fax: [phone]  CC: Referring Provider

## 2019-11-12 NOTE — Telephone Encounter (Signed)
[employer] medicare order sent to [facility]. No auth they will reach out to the patient to schedule.  

## 2019-11-19 ENCOUNTER — Telehealth: Payer: Self-pay | Admitting: Neurology

## 2019-11-19 NOTE — Telephone Encounter (Signed)
The pt left a message on my voicemail stating she was told by Dr. Terrace Arabia she would be scheduled for an MRI. Pt states she has not heard from anyone. Please advise.

## 2019-11-19 NOTE — Telephone Encounter (Signed)
[facility] has tried to reach out to the patient on 11/13/19.

## 2019-11-28 ENCOUNTER — Telehealth: Payer: Self-pay | Admitting: Neurology

## 2019-11-28 DIAGNOSIS — G243 Spasmodic torticollis: Secondary | ICD-10-CM

## 2019-11-28 MED ORDER — ALPRAZOLAM 1 MG PO TABS: ORAL_TABLET | ORAL | 0 refills | Status: DC

## 2019-11-28 NOTE — Telephone Encounter (Signed)
ALPRAZolam (XANAX) 1 MG tablet  0 ordered        Summary: Take 1-2 tablets 30 minutes prior to MRI, may repeat once as needed. Must have driver., Normal  Start: 1/42/3953 Ord/Sold: 11/28/2019 (O) Report Taking:  Long-term:  Pharmacy: [id number] - [place], [place] - [address]     Please let patient know, it is ok to keep early Sept appointment, xanax Rx was sent

## 2019-11-28 NOTE — Telephone Encounter (Signed)
I called [employer] and spoke with Josh T., to check if codes [id number], [id number], and [id number] require PA. He states no PA is required. Patient will be B/B because she has the [facility] Medicare plan. Reference #[id number]. 400U Botox for G24.3. I called the patient and LVM asking her to call me back to schedule her first injection.

## 2019-11-28 NOTE — Telephone Encounter (Signed)
I called patient and LVM to let her know a prescription for Xanax has been sent to [facility] in [place]. I also advised that MD states it is okay to keep her 9/22 Botox injection.

## 2019-11-28 NOTE — Addendum Note (Signed)
Addended by: Levert Feinstein on: 11/28/2019 03:20 PM   Modules accepted: Orders

## 2019-11-28 NOTE — Telephone Encounter (Signed)
Patient returned my call to schedule Botox injection. I scheduled her for first available of 9/22. She states that MD told her that she would like for patient to return in about 4 weeks from her last visit for Botox. If this is the case, please advise if you have a time/day in mind that you would like patient to come in sooner and I will call and reschedule her.   She also mentioned that she has MRI coming up on 8/18. She states she is claustrophobic and would like something to relax her for the scan. FYI

## 2019-12-05 ENCOUNTER — Ambulatory Visit
Admission: RE | Admit: 2019-12-05 | Discharge: 2019-12-05 | Disposition: A | Discharge status: Home / Self Care | Payer: Medicare Other | Source: Ambulatory Visit | Attending: Neurology | Admitting: Neurology

## 2019-12-05 ENCOUNTER — Other Ambulatory Visit: Payer: Self-pay

## 2019-12-05 DIAGNOSIS — R42 Dizziness and giddiness: Secondary | ICD-10-CM

## 2019-12-05 DIAGNOSIS — H905 Unspecified sensorineural hearing loss: Secondary | ICD-10-CM

## 2019-12-05 DIAGNOSIS — G243 Spasmodic torticollis: Secondary | ICD-10-CM

## 2019-12-05 MED ORDER — GADOBENATE DIMEGLUMINE 529 MG/ML IV SOLN
20.0000 mL | Freq: Once | INTRAVENOUS | Status: AC | PRN
  Administered 2019-12-05: 20 mL via INTRAVENOUS

## 2019-12-06 ENCOUNTER — Telehealth: Payer: Self-pay | Admitting: Neurology

## 2019-12-06 NOTE — Telephone Encounter (Signed)
-----   Message from Lilla Shook, RN sent at 12/06/2019  8:53 AM EDT -----  ----- Message ----- From: Interface, Rad Results In Sent: 12/05/2019   9:37 PM EDT To: Levert Feinstein, MD

## 2019-12-06 NOTE — Telephone Encounter (Signed)
I called and left a message.  MRI of the brain is relatively unremarkable, minimal white matter disease is seen.  MRA of the head and neck is relatively unremarkable, no source of hearing loss noted.  MRA head 12/05/19:  IMPRESSION: No intracranial stenosis or large vessel occlusion  2 mm outpouching at the origin of the posterior communicating artery bilaterally compatible with infundibulum.   MRA neck 12/05/19:  IMPRESSION: Negative MRA neck   MRI brain 12/05/19:  IMPRESSION: No acute abnormality.  No cause for hearing loss identified.  Atrophy with mild white matter changes consistent with microvascular ischemia.

## 2019-12-21 ENCOUNTER — Other Ambulatory Visit: Payer: Medicare Other

## 2020-01-09 ENCOUNTER — Other Ambulatory Visit: Payer: Self-pay

## 2020-01-09 ENCOUNTER — Encounter: Payer: Self-pay | Admitting: Neurology

## 2020-01-09 ENCOUNTER — Ambulatory Visit: Payer: Medicare Other | Admitting: Neurology

## 2020-01-09 VITALS — BP 138/80 | HR 79 | Ht 69.0 in | Wt 180.5 lb

## 2020-01-09 DIAGNOSIS — G243 Spasmodic torticollis: Secondary | ICD-10-CM | POA: Diagnosis not present

## 2020-01-09 MED ORDER — ONABOTULINUMTOXINA 100 UNITS IJ SOLR
400.0000 [IU] | Freq: Once | INTRAMUSCULAR | Status: AC
  Administered 2020-01-09: 400 [IU] via INTRAMUSCULAR

## 2020-01-09 NOTE — Addendum Note (Signed)
Addended by: Levert Feinstein on: 01/09/2020 04:08 PM   Modules accepted: Orders, Level of Service

## 2020-01-09 NOTE — Progress Notes (Signed)
PATIENT: Julie Shelton DOB: 1940-07-15  Chief Complaint  Patient presents with  . Cervical Dystonia    Botox     HISTORICAL  Marily Memosdna is a 79 years old right-handed Caucasian female, by herself, referred by her primary care physician Dr. 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  She had a past medical history of hypothyroidism, on supplement,  she was [facility]  Around 2004, wiithout clear trigger, she had gradual onset of head pulling to her left shoulder, difficulty turning her neck to either direction especially to her left, later she also developed shooting pain from her left cervical region down to her left shoulder, numbness involving left fifth finger  She [facility], which showed loss of height with left eccentric disc osteophyte complex at C5-6, and C6 and 7, with mild central canal narrowing, minimum left foraminal narrowing at C6 and 7, repeat MRI of cervical in 2007, a year later, continued to demonstrate spondylosis at C5-6, C6 and 7, with endplate osteophyte, covering bulging disc material, mild canal stenosis, no cord compression, there is no significant foraminal stenosis.  However with her persistent left-sided neck pain, shooting pain to her left shoulder, she underwent anterior cervical discectomy, and fusion at C5-6, C6 and 7 with tricortical autograft, and plating with a short statue synthetic system C5-C7 by Dr.Roy, Ranjan at [facility] in August 2008, this was [place],  She only has minimum improvement of her neck pain postsurgically, eventually she was evaluated by [facility] in October 2008, was correctly diagnosed with spasmodic torticollis, has received repeat EMG guided Botox injection under supervision of Dr. Jeremy JohannJanice Massey, she was receiving injections every 3-5 months, last injection was April 2,2014, she reported mild to  moderate improvement with each injection, due to insurance change, she could no longer [facility]. She desires to transfer her injection locally.  She denied any significant side effect was Botox injection, was not sure about the dosage, but reported continued mild to moderate improvement with each injection, over the past few months, because prolonged interval with the last injection, she noticed worsening neck pain, difficulty turning her neck, and the worsening abnormal posture.  She is also taking clonazepam 0.5 mg twice a day, she has significant neck pain, 8 out of 10, which has put limitation on her daily activity,  She denies gait difficulty, no bilateral arm persistent sensory loss, all weakness,  I started to give her her  EMG guided Xeomin injection in Feb 11th 2015, no significant side effect noticed either.  UPDATE May 13th 2015:  There was no significant improvement following her first EMG guided Xeomin injection in Feb 11th 2015, no significant side effect noticed either. she continued to complains of neck hyperextension, cracking sound when moving her neck, gait difficulty,  We also reviewed MRI of the cervical spine,  which showed multilevel degenerative disc disease, there is no significant canal or foraminal stenosis.  She complains of left breast area tenderness, bloating sensation.  UPDATE Sep 2nd 2015: We did experimental right frontalis xeomin injection in last visit in May 2015, she did notice significant decreased frontalis movement, which has establish that xeomin should still be effective botulinum toxin for her, we did 300 mg of xeomin in last injection May 2015. Reported only mild improvement in her left neck pain, and neck posturing, there was no significant side effect.  Update April 24 2014: She received 400 units of Xeomin injection in September second 2015, she reported moderate improvement, but continue have moderate  disabilitating left posterior neck pain, no significant side effect noticed  Update Aug 28 2014: Last injection in January 2016 works well for her, this time, insurance approved for Botox only, not as previously xeomin, 400 units,  Update Sep 02 2016 She has lost follow-up since last injection in May 2016, now she complains worsening neck pain, limited range of motion, hope to resume her injection  UPDATE September 22 2016: She came in for EMG guided BOTOX injection, we used 300 units  Today  UPDATE Jun 01 2017: She reported mild improvement with previous injection, complains of dizziness, fatigue, constant left-sided neck pain  UPDATE May 24 2018: She has lost follow-up since February 2019, noticed worsening left-sided neck pain, constant neck turning towards the left side, mild radiating pain to left shoulder, denies significant gait abnormality no bowel and bladder incontinence,  She also recently noticed popping sounds at bilateral ear, was seen by ENT,  I was able to review her ENT evaluation on June 10, 2017, 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  Dix-Hallpike maneuver failed to demonstrate spontaneous nystagmus  Laboratory evaluations in January 2020, hemoglobin of 13.4,  UPDATE November 12 2019:  She lost follow-up due to pandemic, complains of worsening left-sided neck tension, radiating neck pain from left occipital area to left temporoparietal, tightness of her neck muscles,  Continue complains of left hearing loss, she did not go for previously ordered MRI of the brain with without contrast  Today she also complains of intermittent dizziness, especially with neck turning, transient movement of going to pass out, she also suffered left lower extremity cellulitis in October 2020, now had a persistent left lower extremity swelling, and numbness of left  lateral 3 toes,  UPDATE Sept 22 2021: Return for EMG guided Botox a injection for cervical dystonia, she complains of neck forceful turning towards the left side, left posterior neck muscle tightness, pain,  We have personally reviewed MRI of the brain, no acute abnormality MRA of the neck no large vessel disease MRA of brain incidental 2 mm outpouching of the origin at the posterior communicating artery bilaterally with infundibulum  She reported improvement with Xanax1 mg 2 tablets as needed, which was given as premedication for MRI, after Xanax, she can move her neck much better,  Now she restarted on clonazepam 0.5 mg as needed, prescription was from her primary care physician, no significant improvement noted,  REVIEW OF SYSTEMS: Full 14 system review of systems performed and notable only for  As above  All rest review of the system were negative ALLERGIES: No Known Allergies  HOME MEDICATIONS: Current Outpatient Medications  Medication Sig Dispense Refill  . botulinum toxin Type A (BOTOX) 100 units SOLR injection Inject 400 Units into the muscle every 3 (three) months.    . cetirizine (ZYRTEC) 10 MG chewable tablet Chew 10 mg by mouth as needed.     . clonazePAM (KLONOPIN) 0.5 MG tablet Take 0.5 mg by mouth daily.    Marland Kitchen escitalopram (LEXAPRO) 10 MG tablet Take 10 mg by mouth daily.    Marland Kitchen ibuprofen (ADVIL,MOTRIN) 200 MG tablet Take 200 mg by mouth every 6 (six) hours as needed.    Marland Kitchen levothyroxine (SYNTHROID, LEVOTHROID) 75 MCG tablet Take 75 mcg by mouth daily before  breakfast.      No current facility-administered medications for this visit.    PAST MEDICAL HISTORY: Past Medical History:  Diagnosis Date  . Allergic rhinitis   . Chronic pain   . Depression   . GERD (gastroesophageal reflux disease)   . Hypothyroidism   . Torticollis   . Vitamin D deficiency     PAST SURGICAL HISTORY: Past Surgical History:  Procedure Laterality Date  . AUGMENTATION MAMMAPLASTY      Removed 5 years ago   . BREAST ENHANCEMENT SURGERY    . breast implants removed    . LAPAROSCOPIC TUBAL LIGATION    . NECK SURGERY    . SHOULDER OPEN ROTATOR CUFF REPAIR     2003    FAMILY HISTORY: Family History  Problem Relation Age of Onset  . Thyroid disease Mother   . Dementia Father     SOCIAL HISTORY:  Social History   Socioeconomic History  . Marital status: Married    Spouse name: Dorma Russell  . Number of children: 2  . Years of education: 1  . Highest education level: Not on file  Occupational History    Employer: RETIRED    Comment: retired  Tobacco Use  . Smoking status: Former Smoker    Types: Cigarettes  . Smokeless tobacco: Never Used  . Tobacco comment: Quit in 1972  Vaping Use  . Vaping Use: Never used  Substance and Sexual Activity  . Alcohol use: Yes    Comment: social  . Drug use: No  . Sexual activity: Not on file  Other Topics Concern  . Not on file  Social History Narrative   Patient lives at home with her husband Dorma Russell) .   Patient is retired.    Education- High school    Caffeine- two cups daily.   Social Determinants of Health   Financial Resource Strain:   . Difficulty of Paying Living Expenses: Not on file  Food Insecurity:   . Worried About Programme researcher, broadcasting/film/video in the Last Year: Not on file  . Ran Out of Food in the Last Year: Not on file  Transportation Needs:   . Lack of Transportation (Medical): Not on file  . Lack of Transportation (Non-Medical): Not on file  Physical Activity:   . Days of Exercise per Week: Not on file  . Minutes of Exercise per Session: Not on file  Stress:   . Feeling of Stress : Not on file  Social Connections:   . Frequency of Communication with Friends and Family: Not on file  . Frequency of Social Gatherings with Friends and Family: Not on file  . Attends Religious Services: Not on file  . Active Member of Clubs or Organizations: Not on file  . Attends Banker Meetings: Not on file   . Marital Status: Not on file  Intimate Partner Violence:   . Fear of Current or Ex-Partner: Not on file  . Emotionally Abused: Not on file  . Physically Abused: Not on file  . Sexually Abused: Not on file     PHYSICAL EXAM   Vitals:   01/09/20 1500  BP: 138/80  Pulse: 79  Weight: 180 lb 8 oz (81.9 kg)  Height: 5\' 9"  (1.753 m)   Not recorded     Body mass index is 26.66 kg/m.  PHYSICAL EXAMNIATION: She has moderate retrocollis, moderate right tilt, moderate left turn, slight left shoulder elevation, severe limited range of motion looking to the right or left,  mild sagittal anterior shift    DIAGNOSTIC DATA (LABS, IMAGING, TESTING) - I reviewed patient records, labs, notes, testing and imaging myself where available.   ASSESSMENT AND PLAN  ELENE DOWNUM is a 79 y.o. female   Dizziness, fainting sensation when turing,  MRA of neck and brain to rule out posterior circulation insufficiency showed no large vessel disease, incidental findings of 2 mm at the origine of PCA bialerally.  Sensory neuronal hearing loss  MRI of the brain w/wo  to rule out structural lesion showed mild generalized atrophy, supratentorium small vessel disease, no acute abnormalities.  Cervical dystonia  Moderate retrocollis, right tilt,  moderate left turn, slight right shoulder elevation, mild anterior sagittal shift, difficulty and limited turing to right  She has significant  atrophy of left lateral posterior neck muscles, milder degree of the right posterior and lateral neck muscles.  Last injection in February 2019, we used Botox a 400 units, reported suboptimal response to Xeomin in the past,  Under EMG guidance, we used 400 units of Botox a today,  Right levator scapula 50 units Left semispinalis 50 units Left longissimus capitis 50 units Left splenius cervix 50 unitsx2=100 units Left splenius capitis 50 units Left iliocostalis 50 units Left levator scapular 50 units  May try  higher dose of clonazepam 0.5 mg 2 tablets as needed    Levert Feinstein, M.D. Ph.D.  [facility] [address] [place], [place] [address] Ph: [phone] Fax: [phone]  CC: Referring Provider

## 2020-01-09 NOTE — Progress Notes (Signed)
**  Botox 100 units x 4 vials, NDC 9233-0076-22, Lot [id number], Exp 04/2022, office supply.//mck,rn**

## 2020-03-24 ENCOUNTER — Ambulatory Visit: Payer: Medicare Other | Admitting: Dermatology

## 2020-03-24 ENCOUNTER — Other Ambulatory Visit: Payer: Self-pay

## 2020-03-24 DIAGNOSIS — L821 Other seborrheic keratosis: Secondary | ICD-10-CM | POA: Diagnosis not present

## 2020-03-24 DIAGNOSIS — L820 Inflamed seborrheic keratosis: Secondary | ICD-10-CM | POA: Diagnosis not present

## 2020-03-24 DIAGNOSIS — L300 Nummular dermatitis: Secondary | ICD-10-CM

## 2020-03-24 DIAGNOSIS — L281 Prurigo nodularis: Secondary | ICD-10-CM

## 2020-03-24 MED ORDER — CLOBETASOL PROPIONATE 0.05 % EX CREA: 1.0000 "application " | TOPICAL_CREAM | CUTANEOUS | 0 refills | Status: DC

## 2020-03-24 NOTE — Progress Notes (Signed)
New Patient Visit  Subjective  Julie Shelton is a 79 y.o. female who presents for the following: check spots (face, L hand, bil legs, sore). Spots on legs are very itchy- she tries not to scratch them.  The following portions of the chart were reviewed this encounter and updated as appropriate:      Review of Systems:  No other skin or systemic complaints except as noted in HPI or Assessment and Plan.  Objective  Well appearing patient in no apparent distress; mood and affect are within normal limits.  A focused examination was performed including face, bil legs, L hand. Relevant physical exam findings are noted in the Assessment and Plan.  Objective  L pretibia: Pink excoriated papules/patch L mid pretibia  Objective  Right Lower Leg/ankle: Firm pink excoriated plaques and nodules  Objective  R zygoma x 1, R post lower neck x 1, L wrist x 1 (3): Erythematous keratotic or waxy stuck-on papule   Objective  L malar cheek x 1: Tan patch 1.5 x 1.0cm  Images     Assessment & Plan  Nummular dermatitis L pretibia  Start Clobetasol cr qd/bid aa leg until clear, avoid f/g/a  Topical steroids (such as triamcinolone, fluocinolone, fluocinonide, mometasone, clobetasol, halobetasol, betamethasone, hydrocortisone) can cause thinning and lightening of the skin if they are used for too long in the same area. Your physician has selected the right strength medicine for your problem and area affected on the body. Please use your medication only as directed by your physician to prevent side effects.    clobetasol cream (TEMOVATE) 0.05 % - L pretibia  Prurigo nodularis Right Lower Leg/ankle  Start Clobetasol cr qd/bid aa until clear  Intralesional injection - Right Lower Leg/ankle Location: R lower leg/ankle  Informed Consent: Discussed risks (infection, pain, bleeding, bruising, thinning of the skin, loss of skin pigment, lack of resolution, and recurrence of lesion) and  benefits of the procedure, as well as the alternatives. Informed consent was obtained. Preparation: The area was prepared a standard fashion.  Anesthesia:none  Procedure Details: An intralesional injection was performed with Kenalog 10 mg/cc. 0.6 cc in total were injected.  Total number of injections: >7  Plan: The patient was instructed on post-op care. Recommend OTC analgesia as needed for pain.   Inflamed seborrheic keratosis (3) R zygoma x 1, R post lower neck x 1, L wrist x 1  Destruction of lesion - R zygoma x 1, R post lower neck x 1, L wrist x 1 Complexity: simple   Destruction method: cryotherapy   Informed consent: discussed and consent obtained   Lesion destroyed using liquid nitrogen: Yes   Region frozen until ice ball extended beyond lesion: Yes   Outcome: patient tolerated procedure well with no complications   Post-procedure details: wound care instructions given    Seborrheic keratosis L malar cheek x 1  Benign, Ln2 x 1, $60  Discussed cosmetic procedure LN2, noncovered.  $60 for 1st lesion and $15 for each additional lesion if done on the same day.  Maximum charge $350.  One touch-up treatment included no charge. Discussed risks of treatment including dyspigmentation, small scar, and/or recurrence.   Destruction of lesion - L malar cheek x 1  Destruction method: cryotherapy   Informed consent: discussed and consent obtained   Lesion destroyed using liquid nitrogen: Yes   Region frozen until ice ball extended beyond lesion: Yes   Outcome: patient tolerated procedure well with no complications   Post-procedure details:  wound care instructions given     Return in about 6 weeks (around 05/05/2020) for Prurigo Nodules, Nummular derm, SK.  I, Sonya Hupman, RMA, am acting as scribe for Willeen Niece, MD . Documentation: I have reviewed the above documentation for accuracy and completeness, and I agree with the above.  Willeen Niece MD

## 2020-04-28 DIAGNOSIS — R0981 Nasal congestion: Secondary | ICD-10-CM | POA: Diagnosis not present

## 2020-04-28 DIAGNOSIS — J309 Allergic rhinitis, unspecified: Secondary | ICD-10-CM | POA: Diagnosis not present

## 2020-04-28 DIAGNOSIS — J343 Hypertrophy of nasal turbinates: Secondary | ICD-10-CM | POA: Diagnosis not present

## 2020-05-14 ENCOUNTER — Other Ambulatory Visit: Payer: Self-pay

## 2020-05-14 ENCOUNTER — Encounter: Payer: Self-pay | Admitting: Dermatology

## 2020-05-14 ENCOUNTER — Ambulatory Visit: Payer: Medicare Other | Admitting: Dermatology

## 2020-05-14 DIAGNOSIS — L570 Actinic keratosis: Secondary | ICD-10-CM | POA: Diagnosis not present

## 2020-05-14 DIAGNOSIS — L300 Nummular dermatitis: Secondary | ICD-10-CM

## 2020-05-14 DIAGNOSIS — L821 Other seborrheic keratosis: Secondary | ICD-10-CM

## 2020-05-14 DIAGNOSIS — L280 Lichen simplex chronicus: Secondary | ICD-10-CM | POA: Diagnosis not present

## 2020-05-14 DIAGNOSIS — L281 Prurigo nodularis: Secondary | ICD-10-CM | POA: Diagnosis not present

## 2020-05-14 NOTE — Patient Instructions (Signed)
Cryotherapy Aftercare  . Wash gently with soap and water everyday.   . Apply Vaseline and Band-Aid daily until healed.  Prior to procedure, discussed risks of blister formation, small wound, skin dyspigmentation, or rare scar following cryotherapy.    Topical steroids (such as triamcinolone, fluocinolone, fluocinonide, mometasone, clobetasol, halobetasol, betamethasone, hydrocortisone) can cause thinning and lightening of the skin if they are used for too long in the same area. Your physician has selected the right strength medicine for your problem and area affected on the body. Please use your medication only as directed by your physician to prevent side effects.   

## 2020-05-14 NOTE — Progress Notes (Signed)
   Follow-Up Visit   Subjective  JODYE SCALI is a 80 y.o. female who presents for the following: Follow-up (Rash on left leg. Has been using Clobetasol cream BID PRN. Patient reports improvement. ) and Seborrheic Keratosis (6 week recheck. Left cheek, left wrist, right zygoma, right posterior neck). Cosmetic SK on L cheek is much better after LN2 last visit.  No longer itching on legs.  Has pink spot on nose to check.    The following portions of the chart were reviewed this encounter and updated as appropriate:      Review of Systems: No other skin or systemic complaints except as noted in HPI or Assessment and Plan.  Objective  Well appearing patient in no apparent distress; mood and affect are within normal limits.  A focused examination was performed including face, legs. Relevant physical exam findings are noted in the Assessment and Plan.  Objective  Left pretibia: Few residual pink papules  Objective  Right Ankle: Violaceous dyspigmented macules/patches c/w scarring, no active lesions  Objective  Left Tip of Nose x1: Pnk macule slight scale   Assessment & Plan  Nummular dermatitis Left pretibia  With [facility] component- improved, no longer itching  Use Clobetasol BID PRN flares. Caution atrophy with long-term use.   Recommend mild soap and moisturizing cream 1-2 times daily to legs.    Topical steroids (such as triamcinolone, fluocinolone, fluocinonide, mometasone, clobetasol, halobetasol, betamethasone, hydrocortisone) can cause thinning and lightening of the skin if they are used for too long in the same area. Your physician has selected the right strength medicine for your problem and area affected on the body. Please use your medication only as directed by your physician to prevent side effects.    Other Related Medications clobetasol cream (TEMOVATE) 0.05 %  Prurigo nodularis Right Ankle  Clear today. Observe. ILK not necessary today.   Use Clobetasol  cream bid PRN flares. Caution atrophy with long-term use.   Recommend mild soap and moisturizing cream 1-2 times daily.    AK (actinic keratosis) Left Tip of Nose x1  Recheck on f/up  Destruction of lesion - Left Tip of Nose x1  Destruction method: cryotherapy   Informed consent: discussed and consent obtained   Lesion destroyed using liquid nitrogen: Yes   Region frozen until ice ball extended beyond lesion: Yes   Outcome: patient tolerated procedure well with no complications   Post-procedure details: wound care instructions given    Seborrheic Keratoses - Faint pigmented macule in treated site L cheek.  Improved when compared to photo  - continue sunscreen qd - Observe, cryotherapy if recurs - Call for any changes   Return if symptoms worsen or fail to improve, for pt will call if AK on nose doesn't clear.   I, Lawson Radar, CMA, am acting as scribe for Willeen Niece, MD.  Documentation: I have reviewed the above documentation for accuracy and completeness, and I agree with the above.  Willeen Niece MD

## 2020-07-21 DIAGNOSIS — G629 Polyneuropathy, unspecified: Secondary | ICD-10-CM | POA: Diagnosis not present

## 2020-07-21 DIAGNOSIS — Z1211 Encounter for screening for malignant neoplasm of colon: Secondary | ICD-10-CM | POA: Diagnosis not present

## 2020-07-21 DIAGNOSIS — R202 Paresthesia of skin: Secondary | ICD-10-CM | POA: Diagnosis not present

## 2020-07-21 DIAGNOSIS — Z0000 Encounter for general adult medical examination without abnormal findings: Secondary | ICD-10-CM | POA: Diagnosis not present

## 2020-07-21 DIAGNOSIS — K219 Gastro-esophageal reflux disease without esophagitis: Secondary | ICD-10-CM | POA: Diagnosis not present

## 2020-07-21 DIAGNOSIS — E559 Vitamin D deficiency, unspecified: Secondary | ICD-10-CM | POA: Diagnosis not present

## 2020-07-21 DIAGNOSIS — G243 Spasmodic torticollis: Secondary | ICD-10-CM | POA: Diagnosis not present

## 2020-07-21 DIAGNOSIS — M7582 Other shoulder lesions, left shoulder: Secondary | ICD-10-CM | POA: Diagnosis not present

## 2020-07-21 DIAGNOSIS — Z79899 Other long term (current) drug therapy: Secondary | ICD-10-CM | POA: Diagnosis not present

## 2020-07-21 DIAGNOSIS — E039 Hypothyroidism, unspecified: Secondary | ICD-10-CM | POA: Diagnosis not present

## 2020-07-28 DIAGNOSIS — R3129 Other microscopic hematuria: Secondary | ICD-10-CM | POA: Diagnosis not present

## 2020-07-28 DIAGNOSIS — M7582 Other shoulder lesions, left shoulder: Secondary | ICD-10-CM | POA: Diagnosis not present

## 2020-07-30 DIAGNOSIS — R3129 Other microscopic hematuria: Secondary | ICD-10-CM | POA: Diagnosis not present

## 2020-09-08 ENCOUNTER — Ambulatory Visit: Payer: Medicare Other | Admitting: Neurology

## 2020-09-08 ENCOUNTER — Encounter: Payer: Self-pay | Admitting: Neurology

## 2020-09-08 VITALS — BP 122/73 | HR 72 | Ht 69.0 in | Wt 170.5 lb

## 2020-09-08 DIAGNOSIS — G243 Spasmodic torticollis: Secondary | ICD-10-CM | POA: Diagnosis not present

## 2020-09-08 DIAGNOSIS — R202 Paresthesia of skin: Secondary | ICD-10-CM | POA: Insufficient documentation

## 2020-09-08 MED ORDER — ONABOTULINUMTOXINA 100 UNITS IJ SOLR
400.0000 [IU] | Freq: Once | INTRAMUSCULAR | Status: AC
  Administered 2020-09-08: 400 [IU] via INTRAMUSCULAR

## 2020-09-08 NOTE — Progress Notes (Signed)
Botox 2 vials, 200 units per vial,  buy and bill. NDC 0973-5329-92 Lot # [id number] Exp 04/2023

## 2020-09-08 NOTE — Progress Notes (Signed)
Chief Complaint  Patient presents with  . New Patient (Initial Visit)    New room, alone. 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 Sx started after she had cellulitis in L leg around 01/2020. Ball of foot feels out of place. No pain in legs.      ASSESSMENT AND PLAN  Julie Shelton is a 80 y.o. female  Cervical dystonia   She has moderate retrocollis, moderate right tilt, moderate left turn, slight left shoulder elevation, severe limited range of motion looking to the right or left, mild sagittal anterior shift, frequent no no head shaking,    Neck pain  Under EMG guidance, we used Botox a 400 units today  Left splenius capitis 50 units Left splenius cervix 75 units Left semispinalis 50 units Left levator scapula 50 units Left longissimus capitis 50 units Left inferior oblique capitis 25 units  Right levator scapular 25 units Right semispinalis 25 units Right inferior oblique capitis 25 units Right splenius capitis 25 units  She will return to clinic in 3 months  Left leg spasm paresthesia  Following her left leg cellulitis in October 2020,  Gabapentin as needed for symptomatic control   DIAGNOSTIC DATA (LABS, IMAGING, TESTING) - I reviewed patient records, labs, notes, testing and imaging myself where available.  MRI of brain w/wo December 05 2019. No acute abnormality.  No cause for hearing loss identified.  Atrophy with mild white matter changes consistent with microvascular Ischemia.  MRA of neck was negative.  MRA of head No intracranial stenosis or large vessel occlusion  2 mm outpouching at the origin of the posterior communicating artery bilaterally compatible with infundibulum.  2015: MRI cervical spine (without) demonstrating:  1. At C3-4 uncovertebral joint hypertrophy and facet hypertrophy with mild  right foraminal stenosis  2. At C4-5: uncovertebral joint  hypertrophy and facet hypertrophy with no  spinal stenosis or foraminal narrowing  3. Anterior discectomy and fusion at C5, C6 and C7 with anterior vertebral  body screws and metal  4. Compared to prior MRI from 05/02/04, the post-surgical changes are new  findings. Also, the degenerative changes at C3-4 and C4-5 have slightly  progressed.   HISTORICAL  Julie Shelton is a 80 year old female, seen in request by her primary care physician gates, [name], to continue with care of her cervical dystonia,  I reviewed and summarized the referring note. Hypothyroidism, on supplement   She was diagnosed with cervical dystonia around 2008 at [facility]  Around 2004, wiithout clear trigger, she had gradual onset of head pulling to her left shoulder, difficulty turning her neck to either direction especially to her left, later she also developed shooting pain from her left cervical region down to her left shoulder, numbness involving left fifth finger  She had MRI of cervical in 2006 at [facility] imaging, which showed loss of height with left eccentric disc osteophyte complex at C5-6, and C6 and 7, with mild central canal narrowing, minimum left foraminal narrowing at C6 and 7, repeat MRI of cervical in 2007, a year later, continued to demonstrate spondylosis at C5-6, C6 and 7, with endplate osteophyte, covering bulging disc material, mild canal stenosis, no cord compression, there is no significant foraminal stenosis.  However with her persistent left-sided neck pain, shooting pain to her left shoulder, she underwent anterior cervical discectomy, and fusion at C5-6, C6 and 7 with tricortical autograft, and plating with a short statue synthetic system  C5-C7 by Dwaine Deter at [facility] in August 2008, this was done at [place], [place],  She only has minimum improvement of her neck pain postsurgically, eventually she was evaluated by [facility] in October 2008, was correctly diagnosed  with spasmodic torticollis, has received repeat EMG guided Botox injection under supervision of Dr. Jeremy Johann, she was receiving injections every 3-5 months, last injection was April 2,2014, she reported mild to moderate improvement with each injection, due to insurance change, she could no longer go to [facility] for continued treatment. She desires to transfer her injection locally.  She denied any significant side effect was Botox injection, was not sure about the dosage, but reported continued mild to moderate improvement with each injection, over the past few months, because prolonged interval with the last injection, she noticed worsening neck pain, difficulty turning her neck, and the worsening abnormal posture.  She is also taking clonazepam 0.5 mg twice a day, she has significant neck pain, 8 out of 10, which has put limitation on her daily activity,  She denies gait difficulty, no bilateral arm persistent sensory loss, all weakness,  I started to give her her  EMG guided Xeomin injection in Feb 11th 2015, no significant side effect noticed either.  UPDATE May 13th 2015:  There was no significant improvement following her first EMG guided Xeomin injection in Feb 11th 2015, no significant side effect noticed either. she continued to complains of neck hyperextension, cracking sound when moving her neck, gait difficulty,  We also reviewed MRI of the cervical spine,  which showed multilevel degenerative disc disease, there is no significant canal or foraminal stenosis.  She complains of left breast area tenderness, bloating sensation.  UPDATE Sep 2nd 2015: We did experimental right frontalis xeomin injection in last visit in May 2015, she did notice significant decreased frontalis movement, which has establish that xeomin should still be effective botulinum toxin for her, we did 300 mg of xeomin in last injection May 2015. Reported only mild improvement in her left neck pain, and neck  posturing, there was no significant side effect.  Update April 24 2014: She received 400 units of Xeomin injection in September second 2015, she reported moderate improvement, but continue have moderate disabilitating left posterior neck pain, no significant side effect noticed  Update Aug 28 2014: Last injection in January 2016 works well for her, this time, insurance approved for Botox only, not as previously xeomin, 400 units,  Update Sep 02 2016 She has lost follow-up since last injection in May 2016, now she complains worsening neck pain, limited range of motion, hope to resume her injection  UPDATE September 22 2016: She came in for EMG guided BOTOX injection, we used 300 units  Today  UPDATE Jun 01 2017: She reported mild improvement with previous injection, complains of dizziness, fatigue, constant left-sided neck pain  UPDATE May 24 2018: She has lost follow-up since February 2019, noticed worsening left-sided neck pain, constant neck turning towards the left side, mild radiating pain to left shoulder, denies significant gait abnormality no bowel and bladder incontinence,  She also recently noticed popping sounds at bilateral ear, was seen by ENT,  I was able to review her ENT evaluation on June 10, 2017, 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  Dix-Hallpike maneuver failed to demonstrate spontaneous nystagmus  Laboratory evaluations in January 2020, hemoglobin of 13.4,  UPDATE November 12 2019:  She lost follow-up due to pandemic, complains of worsening left-sided neck tension, radiating neck pain from left occipital area to left temporoparietal, tightness of her neck muscles,  Continue complains of left hearing loss, she did not go for previously ordered MRI of the brain with without contrast  Today she also complains of intermittent dizziness,  especially with neck turning, transient movement of going to pass out, she also suffered left lower extremity cellulitis in October 2020, now had a persistent left lower extremity swelling, and numbness of left lateral 3 toes,  UPDATE Sept 22 2021: Return for EMG guided Botox a injection for cervical dystonia, she complains of neck forceful turning towards the left side, left posterior neck muscle tightness, pain,  We have personally reviewed MRI of the brain, no acute abnormality MRA of the neck no large vessel disease MRA of brain incidental 2 mm outpouching of the origin at the posterior communicating artery bilaterally with infundibulum  She reported improvement with Xanax1 mg 2 tablets as needed, which was given as premedication for MRI, after Xanax, she can move her neck much better,  Now she restarted on clonazepam 0.5 mg as needed, prescription was from her primary care physician, no significant improvement noted  Update Sep 08, 2020 She lost follow-up since last visit in September 2021, this is more than 6 months from previous injection, she complains of worsening neck pain, pulling backwards, limited range of motion, difficulty turning her neck, no longer feel comfortable driving on the hallway,  She spent most of the time in the yard if she can, it is relaxing for her, but she complains of increased and neck pain, gait abnormality because the neck muscle feels so tight, difficulty turning her neck around, fell few times  She had episode of left leg cellulitis in October 2020, since then, she has frequent left lateral leg muscle cramping, intermittent numbness, sometimes she has to get up in the middle of the night to walk it off, gabapentin was helpful, but she has not taken for a while  PHYSICAL EXAM:   Vitals:   09/08/20 1330  BP: 122/73  Pulse: 72  Weight: 170 lb 8 oz (77.3 kg)  Height: 5\' 9"  (1.753 m)   Not recorded     Body mass index is 25.18 kg/m.    PHYSICAL  EXAM   Vitals:   01/09/20 1500  BP: 138/80  Pulse: 79  Weight: 180 lb 8 oz (81.9 kg)  Height: 5\' 9"  (1.753 m)   Body mass index is 26.66 kg/m.   PHYSICAL EXAMNIATION:  Gen: NAD, conversant, well nourised, well groomed                     Cardiovascular: Regular rate rhythm, no peripheral edema, warm, nontender. Eyes: Conjunctivae clear without exudates or hemorrhage Neck: Supple, no carotid bruits. Pulmonary: Clear to auscultation bilaterally   NEUROLOGICAL EXAM: PHYSICAL EXAMNIATION: She has moderate retrocollis, moderate right tilt, moderate left turn, slight left shoulder elevation, severe limited range of motion looking to the right or left, mild sagittal anterior shift, frequent no no head shaking,    MENTAL STATUS: Speech/Cognition: Awake, alert, normal speech, oriented to history taking and casual conversation.  CRANIAL NERVES: CN II: Visual fields are full to confrontation.  Pupils are round equal and briskly reactive to light. CN III, IV, VI: extraocular movement are normal. No ptosis. CN V: Facial sensation is intact to light touch. CN VII: Face is symmetric with normal eye  closure and smile. CN VIII: Hearing is normal to casual conversation CN IX, X: Palate elevates symmetrically. Phonation is normal. CN XI: Head turning and shoulder shrug are intact CN XII: Tongue is midline with normal movements and no atrophy.  MOTOR: Muscle bulk and tone are normal. Muscle strength is normal.  REFLEXES: Reflexes are 2  and symmetric at the biceps, triceps, knees and ankles. Plantar responses are flexor.  SENSORY: Intact to light touch, pinprick, positional and vibratory sensation at fingers and toes.  COORDINATION: There is no trunk or limb ataxia.    GAIT/STANCE: She needs push-up to get up from seated position, tends to lean  towards her right shoulder, chin turning towards the left side, mild retrocollis, left shoulder elevation  REVIEW OF SYSTEMS:  Full 14  system review of systems performed and notable only for as above All other review of systems were negative.   ALLERGIES: No Known Allergies  HOME MEDICATIONS: Current Outpatient Medications  Medication Sig Dispense Refill  . botulinum toxin Type A (BOTOX) 100 units SOLR injection Inject 400 Units into the muscle every 3 (three) months.    . cetirizine (ZYRTEC) 10 MG chewable tablet Chew 10 mg by mouth as needed.     Marland Kitchen ibuprofen (ADVIL,MOTRIN) 200 MG tablet Take 200 mg by mouth every 6 (six) hours as needed.    Marland Kitchen levothyroxine (SYNTHROID, LEVOTHROID) 75 MCG tablet Take 75 mcg by mouth daily before breakfast.      No current facility-administered medications for this visit.    PAST MEDICAL HISTORY: Past Medical History:  Diagnosis Date  . Allergic rhinitis   . Chronic pain   . Depression   . GERD (gastroesophageal reflux disease)   . Hypothyroidism   . Torticollis   . Vitamin D deficiency     PAST SURGICAL HISTORY: Past Surgical History:  Procedure Laterality Date  . AUGMENTATION MAMMAPLASTY     Removed 5 years ago   . BREAST ENHANCEMENT SURGERY    . breast implants removed    . LAPAROSCOPIC TUBAL LIGATION    . NECK SURGERY    . SHOULDER OPEN ROTATOR CUFF REPAIR     2003    FAMILY HISTORY: Family History  Problem Relation Age of Onset  . Thyroid disease Mother   . Dementia Father     SOCIAL HISTORY: Social History   Socioeconomic History  . Marital status: Married    Spouse name: Julie Shelton  . Number of children: 2  . Years of education: 65  . Highest education level: Not on file  Occupational History    Employer: RETIRED    Comment: retired  Tobacco Use  . Smoking status: Former Smoker    Types: Cigarettes  . Smokeless tobacco: Never Used  . Tobacco comment: Quit in 1972  Vaping Use  . Vaping Use: Never used  Substance and Sexual Activity  . Alcohol use: Yes    Comment: social-beer  . Drug use: No  . Sexual activity: Not on file  Other Topics  Concern  . Not on file  Social History Narrative   Right handed   Patient lives at home with her husband Julie Shelton) .   Patient is retired.    Education- High school    Caffeine- two-three cups daily.   Social Determinants of Health   Financial Resource Strain: Not on file  Food Insecurity: Not on file  Transportation Needs: Not on file  Physical Activity: Not on file  Stress: Not on file  Social Connections:  Not on file  Intimate Partner Violence: Not on file      Levert FeinsteinYijun Sydnee Lamour, M.D. Ph.D.  [facility] [address], [address] [place], [place] [address] Ph: [phone] Fax: [phone]  CC:  Julie NobleGates, Robert, MD [address] [place] [address]  Julie NobleGates, Robert, MD

## 2020-10-22 DIAGNOSIS — G509 Disorder of trigeminal nerve, unspecified: Secondary | ICD-10-CM | POA: Diagnosis not present

## 2020-10-22 DIAGNOSIS — Z79899 Other long term (current) drug therapy: Secondary | ICD-10-CM | POA: Diagnosis not present

## 2020-10-22 DIAGNOSIS — M436 Torticollis: Secondary | ICD-10-CM | POA: Diagnosis not present

## 2020-11-05 DIAGNOSIS — G509 Disorder of trigeminal nerve, unspecified: Secondary | ICD-10-CM | POA: Diagnosis not present

## 2020-11-05 DIAGNOSIS — M436 Torticollis: Secondary | ICD-10-CM | POA: Diagnosis not present

## 2020-12-09 ENCOUNTER — Ambulatory Visit: Payer: Self-pay | Admitting: Neurology

## 2020-12-31 ENCOUNTER — Other Ambulatory Visit: Payer: Self-pay | Admitting: Nurse Practitioner

## 2020-12-31 DIAGNOSIS — N6311 Unspecified lump in the right breast, upper outer quadrant: Secondary | ICD-10-CM | POA: Diagnosis not present

## 2020-12-31 DIAGNOSIS — N6320 Unspecified lump in the left breast, unspecified quadrant: Secondary | ICD-10-CM | POA: Diagnosis not present

## 2021-01-12 ENCOUNTER — Ambulatory Visit
Admission: RE | Admit: 2021-01-12 | Discharge: 2021-01-12 | Disposition: A | Discharge status: Home / Self Care | Payer: Medicare Other | Source: Ambulatory Visit | Attending: Nurse Practitioner | Admitting: Nurse Practitioner

## 2021-01-12 ENCOUNTER — Other Ambulatory Visit: Payer: Self-pay

## 2021-01-12 ENCOUNTER — Other Ambulatory Visit: Payer: Self-pay | Admitting: Nurse Practitioner

## 2021-01-12 DIAGNOSIS — N6312 Unspecified lump in the right breast, upper inner quadrant: Secondary | ICD-10-CM | POA: Diagnosis not present

## 2021-01-12 DIAGNOSIS — N6320 Unspecified lump in the left breast, unspecified quadrant: Secondary | ICD-10-CM

## 2021-01-12 DIAGNOSIS — N6311 Unspecified lump in the right breast, upper outer quadrant: Secondary | ICD-10-CM | POA: Diagnosis not present

## 2021-01-12 DIAGNOSIS — R922 Inconclusive mammogram: Secondary | ICD-10-CM | POA: Diagnosis not present

## 2021-01-20 DIAGNOSIS — G629 Polyneuropathy, unspecified: Secondary | ICD-10-CM | POA: Diagnosis not present

## 2021-01-20 DIAGNOSIS — R030 Elevated blood-pressure reading, without diagnosis of hypertension: Secondary | ICD-10-CM | POA: Diagnosis not present

## 2021-01-20 DIAGNOSIS — G509 Disorder of trigeminal nerve, unspecified: Secondary | ICD-10-CM | POA: Diagnosis not present

## 2021-01-22 ENCOUNTER — Other Ambulatory Visit: Payer: Medicare Other

## 2021-03-26 DIAGNOSIS — G509 Disorder of trigeminal nerve, unspecified: Secondary | ICD-10-CM | POA: Diagnosis not present

## 2021-03-26 DIAGNOSIS — G629 Polyneuropathy, unspecified: Secondary | ICD-10-CM | POA: Diagnosis not present

## 2021-03-26 DIAGNOSIS — R030 Elevated blood-pressure reading, without diagnosis of hypertension: Secondary | ICD-10-CM | POA: Diagnosis not present

## 2021-03-26 DIAGNOSIS — M436 Torticollis: Secondary | ICD-10-CM | POA: Diagnosis not present

## 2021-07-27 DIAGNOSIS — H43822 Vitreomacular adhesion, left eye: Secondary | ICD-10-CM | POA: Diagnosis not present

## 2021-07-29 DIAGNOSIS — M7582 Other shoulder lesions, left shoulder: Secondary | ICD-10-CM | POA: Diagnosis not present

## 2021-07-29 DIAGNOSIS — K219 Gastro-esophageal reflux disease without esophagitis: Secondary | ICD-10-CM | POA: Diagnosis not present

## 2021-07-29 DIAGNOSIS — E039 Hypothyroidism, unspecified: Secondary | ICD-10-CM | POA: Diagnosis not present

## 2021-07-29 DIAGNOSIS — E559 Vitamin D deficiency, unspecified: Secondary | ICD-10-CM | POA: Diagnosis not present

## 2021-07-29 DIAGNOSIS — M8589 Other specified disorders of bone density and structure, multiple sites: Secondary | ICD-10-CM | POA: Diagnosis not present

## 2021-07-29 DIAGNOSIS — Z1211 Encounter for screening for malignant neoplasm of colon: Secondary | ICD-10-CM | POA: Diagnosis not present

## 2021-07-29 DIAGNOSIS — Z0000 Encounter for general adult medical examination without abnormal findings: Secondary | ICD-10-CM | POA: Diagnosis not present

## 2021-07-31 ENCOUNTER — Other Ambulatory Visit: Payer: Self-pay | Admitting: Internal Medicine

## 2021-07-31 DIAGNOSIS — M8589 Other specified disorders of bone density and structure, multiple sites: Secondary | ICD-10-CM

## 2021-08-07 DIAGNOSIS — N029 Recurrent and persistent hematuria with unspecified morphologic changes: Secondary | ICD-10-CM | POA: Diagnosis not present

## 2021-09-03 DIAGNOSIS — E039 Hypothyroidism, unspecified: Secondary | ICD-10-CM | POA: Diagnosis not present

## 2021-09-03 DIAGNOSIS — G4701 Insomnia due to medical condition: Secondary | ICD-10-CM | POA: Diagnosis not present

## 2021-09-03 DIAGNOSIS — M436 Torticollis: Secondary | ICD-10-CM | POA: Diagnosis not present

## 2021-09-08 ENCOUNTER — Ambulatory Visit: Payer: Medicare Other | Attending: Internal Medicine | Admitting: Physical Therapy

## 2021-09-08 ENCOUNTER — Encounter: Payer: Self-pay | Admitting: Physical Therapy

## 2021-09-08 ENCOUNTER — Other Ambulatory Visit: Payer: Self-pay

## 2021-09-08 DIAGNOSIS — M542 Cervicalgia: Secondary | ICD-10-CM | POA: Diagnosis not present

## 2021-09-08 NOTE — Therapy (Unsigned)
OUTPATIENT PHYSICAL THERAPY CERVICAL EVALUATION   Patient Name: Julie Shelton MRN: [id number] DOB:1940/05/28, 81 y.o., female Today's Date: 09/09/2021   PT End of Session - 09/09/21 1324     Visit Number 1    Number of Visits 16    Date for PT Re-Evaluation 11/03/21    Authorization Type [employer] Medicare    Progress Note Due on Visit 10    Activity Tolerance Patient tolerated treatment well    Behavior During Therapy WFL for tasks assessed/performed             Past Medical History:  Diagnosis Date   Allergic rhinitis    Chronic pain    Depression    GERD (gastroesophageal reflux disease)    Hypothyroidism    Torticollis    Vitamin D deficiency    Past Surgical History:  Procedure Laterality Date   AUGMENTATION MAMMAPLASTY     Removed 5 years ago    BREAST ENHANCEMENT SURGERY     breast implants removed     LAPAROSCOPIC TUBAL LIGATION     NECK SURGERY     SHOULDER OPEN ROTATOR CUFF REPAIR     2003   Patient Active Problem List   Diagnosis Date Noted   Left leg paresthesias 09/08/2020   Sensorineural hearing loss (SNHL) of left ear 11/12/2019   Dizziness after extension of neck 11/12/2019   Exertional dyspnea 06/17/2018   Palpitations 06/17/2018   Other fatigue 06/17/2018   Hearing loss 05/24/2018   Cervical dystonia 04/24/2014   Cervicalgia 05/31/2013   Torticollis    Depression    GERD (gastroesophageal reflux disease)    Hypothyroidism    Allergic rhinitis    Chronic pain    Vitamin D deficiency     PCP: Dr. Marden Noble   REFERRING PROVIDER: Dr. Hillard Danker   REFERRING DIAG: Cervical Torticollis   THERAPY DIAG:  Cervicalgia - Plan: PT plan of care cert/re-cert  Rationale for Evaluation and Treatment Rehabilitation  ONSET DATE: 04/19/2006  SUBJECTIVE:                                                                                                                                                                                                          SUBJECTIVE STATEMENT: Pt reports that she has increased right upper trap pain and pain behind her ear. Pain really started to increased around the fall. Her ability to turn her head to scan her environment. She received a surgery for cervical fusion and the cervical torticollis started shortly thereafter.   PERTINENT HISTORY:  Per [employer] note on 06/20/2018  Had cervical fusion about 10-12 years ago due to neck pain.  Had very minimal torticolis prior to her cervical surgery. However after her neck surgery, her torticolis became more prominent.  Did not have PT right after the neck surgery. Had PT for her neck 10 years after the surgery.  Had PT for her neck a few years ago which helped.  Difficult to do her exercises at home.  Had soft tissue work on her neck with PT.  Pt feels tight muscles towards the L side of the back of her neck.  Pt states that her head keeps turning to her L.  The clonopine does not really help with her muscle spasms in her neck.  Feels like her neck is squishing together.    PAIN:  Are you having pain? Yes: NPRS scale: 8/10 Pain location: Left  SCM and all along left side of face Pain description: Aches Aggravating factors: Constantly pain  Relieving factors: Laying down   PRECAUTIONS: None  WEIGHT BEARING RESTRICTIONS No  FALLS:  Has patient fallen in last 6 months? No  LIVING ENVIRONMENT: Lives with: lives with their spouse Lives in: House/apartment Stairs: No Has following equipment at home: None  OCCUPATION: Retired   PLOF: Independent  PATIENT GOALS Reduce neck and face pain so she can do more activities outdoors   OBJECTIVE:   VITALS: BP 143/80 HR 74 SpO2 98   DIAGNOSTIC FINDINGS:  N/a   PATIENT SURVEYS:  FOTO 38/47   COGNITION: Overall cognitive status: Within functional limits for tasks assessed   SENSATION: WFL  POSTURE: rounded shoulders and forward head  PALPATION: TTP along left SCM from collar bone all the way  to left mastoid      CERVICAL AND UPPER EXTREMITY MMT:   MMT  Flexion 5  Extension 5  Right lateral flexion 5  Left lateral flexion 5  Right rotation 5  Left rotation 5    MMT Right eval Left eval  Shoulder flexion 5 5  Shoulder extension 5 5  Shoulder abduction 5 5  Shoulder adduction        Shoulder internal rotation    Shoulder external rotation    Elbow flexion    Elbow extension    Wrist flexion    Wrist extension    Wrist ulnar deviation    Wrist radial deviation    Wrist pronation    Wrist supination     (Blank rows = not tested)  CERVICAL ROM:   Active ROM A/PROM (deg) eval  Flexion 30  Extension 30  Right lateral flexion 30  Left lateral flexion 20  Right rotation 30  Left rotation 20   (Blank rows = not tested)   UPPER EXTREMITY ROM:  MMT Right eval Left eval  Shoulder flexion 180 180  Shoulder extension 60 60  Shoulder abduction 180 180  Shoulder adduction    Shoulder extension    Shoulder internal rotation    Shoulder external rotation    Middle trapezius    Lower trapezius    Elbow flexion 150 150  Elbow extension 0 0  Wrist flexion    Wrist extension    Wrist ulnar deviation    Wrist radial deviation    Wrist pronation    Wrist supination    Grip strength     (Blank rows = not tested)        PATIENT SURVEYS:  FOTO 38/47  TODAY'S TREATMENT:  09/08/21 Upper  Trap Stretch 2 x 30 sec  SCM Stretch 2 x 30 sec    PATIENT EDUCATION:  Education details: form and technique for appropriate exercise and explanation of plan of care  Person educated: Patient Education method: Explanation, Demonstration, Verbal cues, and Handouts Education comprehension: verbalized understanding, returned demonstration, and verbal cues required   HOME EXERCISE PROGRAM: Access Code: [email or web address] URL: [email or web address] Date: 09/08/2021 Prepared by: Ellin Goodieaniel Emmilynn Marut  Exercises - Seated Upper Trapezius Stretch  - 1 x daily - 7  x weekly - 1 sets - 3 reps - 60 hold - Sternocleidomastoid Stretch  - 1 x daily - 7 x weekly - 1 sets - 3 reps - 60 hold  ASSESSMENT:  CLINICAL IMPRESSION: Patient is a 81 y.o. white woman who was seen today for physical therapy evaluation and treatment for neck pain and stiffness in the setting of dystonia from cervical torticollis. She exhibits decreased cervical ROM and increased neck pain that is limiting her ability to effectively scan her environment and to decrease her risk of falling.    OBJECTIVE IMPAIRMENTS decreased ROM, hypomobility, impaired flexibility, and pain.   ACTIVITY LIMITATIONS caring for others and walking  PARTICIPATION LIMITATIONS: laundry, community activity, yard work, and church  PERSONAL FACTORS Age and Time since onset of injury/illness/exacerbation are also affecting patient's functional outcome.   REHAB POTENTIAL: Fair chronicity of condition and failed past treatments using botox  CLINICAL DECISION MAKING: Stable/uncomplicated  EVALUATION COMPLEXITY: Low   GOALS: Goals reviewed with patient? No  SHORT TERM GOALS: Target date: 09/23/2021   Pt will be independent with HEP in order to improve strength and balance in order to decrease fall risk and improve function at home and work. Baseline: NT Goal status: INITIAL    LONG TERM GOALS: Target date: 11/04/2021  Patient will have improved function and activity level as evidenced by an increase in FOTO score by 10 points or more.  Baseline: 38/47 Goal status: INITIAL  2.  Patient will increase cervical A/PROM to within normal limits to be able effectively scan environment to avoid tripping on objects in her path.  Baseline: Cervical Flex 30, Ext 30, Right lat flex 30, Left lat flex 20, Right Rot 30, Left Rot 20 Goal status: INITIAL   PLAN: PT FREQUENCY: 1-2x/week  PT DURATION: 8 weeks  PLANNED INTERVENTIONS: Therapeutic exercises, Therapeutic activity, Neuromuscular re-education, Balance  training, Gait training, Patient/Family education, Joint mobilization, Dry Needling, Electrical stimulation, Spinal manipulation, Spinal mobilization, Traction, Manual therapy, and Re-evaluation, Vibratory Stimulation Therapy  PLAN FOR NEXT SESSION: Assess parascapular strength and remainder of shoulder strength. Attempt contract relax with neck to decrease dystonia.   Ellin Goodieaniel Sanyiah Kanzler PT, DPT  09/09/2021, 1:25 PM

## 2021-09-10 ENCOUNTER — Ambulatory Visit: Payer: Medicare Other

## 2021-09-10 ENCOUNTER — Encounter: Payer: Self-pay | Admitting: Physical Therapy

## 2021-09-10 DIAGNOSIS — M542 Cervicalgia: Secondary | ICD-10-CM

## 2021-09-10 NOTE — Therapy (Signed)
[facility] [address], [place], [address] Phone: [phone]   Fax:  [phone]  Physical Therapy Treatment  Patient Details  Name: Julie Shelton MRN: [id number] Date of Birth: [birth year] No data recorded  Encounter Date: 09/10/2021   PT End of Session - 09/10/21 1439     Visit Number 2    Number of Visits 16    Date for PT Re-Evaluation 11/03/21    Authorization Type [employer] Medicare    Progress Note Due on Visit 10    PT Start Time 1439   pt late to session   PT Stop Time 1515    PT Time Calculation (min) 36 min    Activity Tolerance Patient tolerated treatment well    Behavior During Therapy WFL for tasks assessed/performed             Past Medical History:  Diagnosis Date   Allergic rhinitis    Chronic pain    Depression    GERD (gastroesophageal reflux disease)    Hypothyroidism    Torticollis    Vitamin D deficiency     Past Surgical History:  Procedure Laterality Date   AUGMENTATION MAMMAPLASTY     Removed 5 years ago    BREAST ENHANCEMENT SURGERY     breast implants removed     [place]     2003    There were no vitals filed for this visit.   Subjective Assessment - 09/10/21 1437     Subjective Pt reports 8/10 NPS currently in her neck region    Pertinent History Cervicalgia, cervical dystonia. Pt currently wearing a heart monitor as well. Had cervical fusion about 10-12 years ago due to neck pain.  Had very minimal torticolis prior to her cervical surgery. However after her neck surgery, her torticolis became more prominent.  Did not have PT right after the neck surgery. Had PT for her neck 10 years after the surgery.  Had PT for her neck a few years ago which helped.  Difficult to do her exercises at home.  Had soft tissue work on her neck with PT.  Pt feels tight muscles towards the L side of the  back of her neck.  Pt states that her head keeps turning to her L.  The clonopine does not really help with her muscle spasms in her neck.  Feels like her neck is squishing together.     Patient Stated Goals Want to be able to drive better (look around), be able to garden/yard work, clean her house.     Currently in Pain? Yes    Pain Score 8     Pain Onset More than a month ago              There.ex:   Reassessment of HEP as listed below: performed bilaterally. Min multimodal cuing for form/technique.      HOME EXERCISE PROGRAM: Access Code: [email or web address] URL: [email or web address] Date: 09/08/2021 Prepared by: Bradly Chris   Exercises - Seated Upper Trapezius Stretch  - 1 x daily - 7 x weekly - 1 sets - 3 reps - 60 hold - Sternocleidomastoid Stretch  - 1 x daily - 7 x weekly - 1 sets - 3 reps - 60 hold    Supine manual stretches:   R upper trap stretch with PT overpressure: 2x30 sec   L  upper trap stretch with PT overpressure: 1x30 sec  R cervical lateral flexion + L rotation for L SCM stretch: 3x30 sec   Passive upper cervical + capital flexion stretch: 2x30 sec   Supine chin tucks: 2x20. Min to [employer] modal cues for form/technique  Supine B shoulder AAROM with dowel for posture: 2x20  Seated Scapular retractions: GTB, 2x20 with min VC's for reducing UT activation.    PT Education - 09/10/21 1438     Education Details form/technique with exercise    Person(s) Educated Patient    Methods Explanation;Demonstration;Tactile cues;Verbal cues;Handout    Comprehension Verbalized understanding;Verbal cues required              PT Short Term Goals - 06/20/18 1803       PT SHORT TERM GOAL #1   Title Patient will be independent with her HEP to decrease pain, improve function.     Baseline Pt has started her HEP (06/20/2018)    Time 3    Period Weeks    Status New    Target Date 07/13/18               PT Long Term Goals - 06/20/18 1803        PT LONG TERM GOAL #1   Title Patient will have a decrease in neck pain to 4/10 or less at worst to promote ability to look around, perform chores, drive more comfortably.     Baseline 8/10 neck pain at worst (06/20/2018)    Time 8    Period Weeks    Status New    Target Date 08/17/18      PT LONG TERM GOAL #2   Title Patient will have a decrease in L upper trap pain to 3/10 or less at worst to promote ability to perform functional tasks more comfortably.     Baseline 7/10 L upper trap area pain at worst (06/20/2018)    Time 8    Period Weeks    Status New    Target Date 08/17/18      PT LONG TERM GOAL #3   Title Pt will improve cervical rotation by at least 10 degrees each direction to promote ability to look around more comfortably.     Baseline Cervical rotation: 0 degrees R, 21 degrees L (06/20/2018)    Time 8    Period Weeks    Status New    Target Date 08/17/18                   Plan - 09/10/21 1556     Clinical Impression Statement Pt received for follow up treatment after evaluation. Pt demonstrating good understadning of HEP but remains significantly limited due to her dystonina. Use of supine exercises for gravity assist in neutral cervical positioning. Pt with no change in pain with mobility grossly the same as prior to session. Pt will continue to benefit from consistent skilled PT services in attempts to improve neck pain and mobility to improve QoL.             Patient will benefit from skilled therapeutic intervention in order to improve the following deficits and impairments:     Visit Diagnosis: Cervicalgia     Problem List Patient Active Problem List   Diagnosis Date Noted   Left leg paresthesias 09/08/2020   Sensorineural hearing loss (SNHL) of left ear 11/12/2019   Dizziness after extension of neck 11/12/2019   Exertional dyspnea 06/17/2018   Palpitations  06/17/2018   Other fatigue 06/17/2018   Hearing loss 05/24/2018   Cervical dystonia  04/24/2014   Cervicalgia 05/31/2013   Torticollis    Depression    GERD (gastroesophageal reflux disease)    Hypothyroidism    Allergic rhinitis    Chronic pain    Vitamin D deficiency     Julie Shelton M. Fairly IV, PT, DPT Physical Therapist- [facility]  09/10/2021, 4:10 PM  [facility] [address] [place], [address] Phone: [phone]   Fax:  [phone]  Name: Julie Shelton MRN: [id number] Date of Birth: [birth year]

## 2021-09-17 ENCOUNTER — Ambulatory Visit: Payer: Medicare Other | Admitting: Physical Therapy

## 2021-09-21 ENCOUNTER — Ambulatory Visit: Payer: Medicare Other | Attending: Internal Medicine | Admitting: Physical Therapy

## 2021-09-21 DIAGNOSIS — M542 Cervicalgia: Secondary | ICD-10-CM | POA: Insufficient documentation

## 2021-09-28 ENCOUNTER — Encounter: Payer: Self-pay | Admitting: Physical Therapy

## 2021-09-28 ENCOUNTER — Ambulatory Visit: Payer: Medicare Other | Admitting: Physical Therapy

## 2021-09-28 DIAGNOSIS — M542 Cervicalgia: Secondary | ICD-10-CM | POA: Diagnosis not present

## 2021-09-28 NOTE — Therapy (Signed)
[facility] [address], [place], [address] Phone: [phone]   Fax:  [phone]  Physical Therapy Treatment  Patient Details  Name: Julie Shelton MRN: [id number] Date of Birth: Jul 18, 1940 No data recorded  Encounter Date: 09/28/2021    Past Medical History:  Diagnosis Date   Allergic rhinitis    Chronic pain    Depression    GERD (gastroesophageal reflux disease)    Hypothyroidism    Torticollis    Vitamin D deficiency     Past Surgical History:  Procedure Laterality Date   AUGMENTATION MAMMAPLASTY     Removed 5 years ago    BREAST ENHANCEMENT SURGERY     breast implants removed     [place]     2003    There were no vitals filed for this visit.         Supine manual stretches:  R upper trap stretch with PT overpressure: 2x30 sec  L upper trap stretch with PT overpressure: 1x30 sec R cervical lateral flexion + L rotation for L SCM stretch: 3x30 sec  Passive upper cervical + capital flexion stretch: 2x30 sec  STM to L SCM/UT/cervical flexors   Conversation with patient about including SLP in POC with understanding and agreement  There Act Education on posture and posture neutral with patient able to correct 50% with maximal effort. Education on continuing this practice with use of mirrors if needed with understanding.                           PT Short Term Goals - 06/20/18 1803       PT SHORT TERM GOAL #1   Title Patient will be independent with her HEP to decrease pain, improve function.     Baseline Pt has started her HEP (06/20/2018)    Time 3    Period Weeks    Status New    Target Date 07/13/18               PT Long Term Goals - 06/20/18 1803       PT LONG TERM GOAL #1   Title Patient will have a decrease in neck pain to 4/10 or less at worst to promote  ability to look around, perform chores, drive more comfortably.     Baseline 8/10 neck pain at worst (06/20/2018)    Time 8    Period Weeks    Status New    Target Date 08/17/18      PT LONG TERM GOAL #2   Title Patient will have a decrease in L upper trap pain to 3/10 or less at worst to promote ability to perform functional tasks more comfortably.     Baseline 7/10 L upper trap area pain at worst (06/20/2018)    Time 8    Period Weeks    Status New    Target Date 08/17/18      PT LONG TERM GOAL #3   Title Pt will improve cervical rotation by at least 10 degrees each direction to promote ability to look around more comfortably.     Baseline Cervical rotation: 0 degrees R, 21 degrees L (06/20/2018)    Time 8    Period Weeks    Status New    Target Date 08/17/18  Patient will benefit from skilled therapeutic intervention in order to improve the following deficits and impairments:     Visit Diagnosis: No diagnosis found.     Problem List Patient Active Problem List   Diagnosis Date Noted   Left leg paresthesias 09/08/2020   Sensorineural hearing loss (SNHL) of left ear 11/12/2019   Dizziness after extension of neck 11/12/2019   Exertional dyspnea 06/17/2018   Palpitations 06/17/2018   Other fatigue 06/17/2018   Hearing loss 05/24/2018   Cervical dystonia 04/24/2014   Cervicalgia 05/31/2013   Torticollis    Depression    GERD (gastroesophageal reflux disease)    Hypothyroidism    Allergic rhinitis    Chronic pain    Vitamin D deficiency    Durwin Reges DPT Durwin Reges, PT 09/28/2021, 1:58 PM  [facility] [address] [place], [address] Phone: [phone]   Fax:  [phone]  Name: Julie Shelton MRN: [id number] Date of Birth: 10-May-1940

## 2021-10-05 ENCOUNTER — Ambulatory Visit: Payer: Medicare Other

## 2021-10-05 ENCOUNTER — Encounter: Payer: Self-pay | Admitting: Physical Therapy

## 2021-10-05 DIAGNOSIS — M542 Cervicalgia: Secondary | ICD-10-CM

## 2021-10-05 NOTE — Therapy (Signed)
[facility] [address], [place], [address] Phone: [phone]   Fax:  [phone]  Physical Therapy Treatment  Patient Details  Name: Julie Shelton MRN: [id number] Date of Birth: 03-06-1941 No data recorded  Encounter Date: 10/05/2021   PT End of Session - 10/05/21 1439     Visit Number 4    Number of Visits 16    Date for PT Re-Evaluation 11/03/21    Authorization Type UHC Medicare    Authorization Time Period of 10 progress report (06/20/2018)    Authorization - Number of Visits 4    Progress Note Due on Visit 10    PT Start Time 1436   pt late to session   PT Stop Time 1514    PT Time Calculation (min) 38 min    Activity Tolerance Patient tolerated treatment well    Behavior During Therapy WFL for tasks assessed/performed             Past Medical History:  Diagnosis Date   Allergic rhinitis    Chronic pain    Depression    GERD (gastroesophageal reflux disease)    Hypothyroidism    Torticollis    Vitamin D deficiency     Past Surgical History:  Procedure Laterality Date   AUGMENTATION MAMMAPLASTY     Removed 5 years ago    BREAST ENHANCEMENT SURGERY     breast implants removed     LAPAROSCOPIC TUBAL LIGATION     NECK SURGERY     SHOULDER OPEN ROTATOR CUFF REPAIR     2003    There were no vitals filed for this visit.   Subjective Assessment - 10/05/21 1438     Subjective Pt reports L neck pain in 8/10 NPS. Pt reports being very active over the weekend.    Pertinent History Cervicalgia, cervical dystonia. Pt currently wearing a heart monitor as well. Had cervical fusion about 10-12 years ago due to neck pain.  Had very minimal torticolis prior to her cervical surgery. However after her neck surgery, her torticolis became more prominent.  Did not have PT right after the neck surgery. Had PT for her neck 10 years after the surgery.  Had PT for her neck a few years ago which helped.   Difficult to do her exercises at home.  Had soft tissue work on her neck with PT.  Pt feels tight muscles towards the L side of the back of her neck.  Pt states that her head keeps turning to her L.  The clonopine does not really help with her muscle spasms in her neck.  Feels like her neck is squishing together.     Patient Stated Goals Want to be able to drive better (look around), be able to garden/yard work, clean her house.     Currently in Pain? Yes    Pain Score 8     Pain Location Neck    Pain Orientation Left    Pain Onset More than a month ago             Manual therapy: 20 minutes in supine for improvement in cervical tone and decreased pain  R upper trap stretch with PT overpressure: 2x30 sec  L upper trap stretch with PT overpressure: 2x30 sec R cervical lateral flexion + L rotation for L SCM stretch: 2x 1 minutes Passive upper cervical + capital flexion stretch: 2x30 sec  STM to L SCM, UT, and  occipitals (10 minutes total)    There.ex:   Attempted seated cervical retractions. Unable to perform in sitting    Supine cervical retractions in gravity assisted position: 2x20   Seated scap retractions: GTB, 2x15, VC's for form/technique with good carryover.       PT Education - 10/05/21 1439     Education Details form/technique with exercise.    Person(s) Educated Patient    Methods Demonstration;Explanation    Comprehension Verbalized understanding;Returned demonstration              PT Short Term Goals - 06/20/18 1803       PT SHORT TERM GOAL #1   Title Patient will be independent with her HEP to decrease pain, improve function.     Baseline Pt has started her HEP (06/20/2018)    Time 3    Period Weeks    Status New    Target Date 07/13/18               PT Long Term Goals - 06/20/18 1803       PT LONG TERM GOAL #1   Title Patient will have a decrease in neck pain to 4/10 or less at worst to promote ability to look around, perform chores, drive  more comfortably.     Baseline 8/10 neck pain at worst (06/20/2018)    Time 8    Period Weeks    Status New    Target Date 08/17/18      PT LONG TERM GOAL #2   Title Patient will have a decrease in L upper trap pain to 3/10 or less at worst to promote ability to perform functional tasks more comfortably.     Baseline 7/10 L upper trap area pain at worst (06/20/2018)    Time 8    Period Weeks    Status New    Target Date 08/17/18      PT LONG TERM GOAL #3   Title Pt will improve cervical rotation by at least 10 degrees each direction to promote ability to look around more comfortably.     Baseline Cervical rotation: 0 degrees R, 21 degrees L (06/20/2018)    Time 8    Period Weeks    Status New    Target Date 08/17/18                   Plan - 10/05/21 1520     Clinical Impression Statement Continuing to trial manul techniques to relieve muscular tension. MIniaml relief with manual techniques and therex throughout session due to pt's dystonia. Significant limitations in sititng with cervical mobility relying on supine for gravity reduced for relaxing cervical extensors. Pt will continue to benefit from skilled PT intervention to attempt to improve cervical mobility and limitations. Educated to continuue trialing neutral postural positioning.    Personal Factors and Comorbidities Age;Comorbidity 1;Time since onset of injury/illness/exacerbation    Comorbidities hx of neck surgery    Examination-Activity Limitations Other    Examination-Participation Restrictions Cleaning;Laundry;Driving;Yard Work    Conservation officer, historic buildings Evolving/Moderate complexity    Rehab Potential Fair    PT Frequency 2x / week    PT Duration 8 weeks    PT Treatment/Interventions Therapeutic exercise;Therapeutic activities;Neuromuscular re-education;Patient/family education;Manual techniques;Dry needling;Aquatic Therapy;Electrical Stimulation;Iontophoresis 4mg /ml Dexamethasone    PT Next Visit  Plan gentle anterior cervical strengthening, scapular strengthening, stretching, manual techniques, modalities PRN    Consulted and Agree with Plan of Care Patient  Patient will benefit from skilled therapeutic intervention in order to improve the following deficits and impairments:  Pain, Postural dysfunction, Improper body mechanics, Increased muscle spasms, Decreased strength, Decreased range of motion  Visit Diagnosis: Cervicalgia     Problem List Patient Active Problem List   Diagnosis Date Noted   Left leg paresthesias 09/08/2020   Sensorineural hearing loss (SNHL) of left ear 11/12/2019   Dizziness after extension of neck 11/12/2019   Exertional dyspnea 06/17/2018   Palpitations 06/17/2018   Other fatigue 06/17/2018   Hearing loss 05/24/2018   Cervical dystonia 04/24/2014   Cervicalgia 05/31/2013   Torticollis    Depression    GERD (gastroesophageal reflux disease)    Hypothyroidism    Allergic rhinitis    Chronic pain    Vitamin D deficiency     Julie Shelton IV, PT, DPT Physical Therapist- [facility]  10/05/2021, 4:26 PM  [facility] [address], [place], [address] Phone: [phone]   Fax:  [phone]  Name: Julie Shelton MRN: [id number] Date of Birth: 1940-05-13

## 2021-10-07 ENCOUNTER — Ambulatory Visit: Payer: Medicare Other | Admitting: Physical Therapy

## 2021-10-12 ENCOUNTER — Ambulatory Visit: Payer: Medicare Other | Admitting: Physical Therapy

## 2021-10-12 DIAGNOSIS — M542 Cervicalgia: Secondary | ICD-10-CM | POA: Diagnosis not present

## 2021-10-14 ENCOUNTER — Encounter: Payer: Medicare Other | Admitting: Physical Therapy

## 2021-10-14 ENCOUNTER — Ambulatory Visit: Payer: Medicare Other | Admitting: Physical Therapy

## 2021-10-19 ENCOUNTER — Ambulatory Visit: Payer: Medicare Other | Admitting: Physical Therapy

## 2021-10-21 ENCOUNTER — Ambulatory Visit: Payer: Medicare Other | Admitting: Physical Therapy

## 2021-10-26 ENCOUNTER — Ambulatory Visit: Payer: Medicare Other | Attending: Internal Medicine | Admitting: Physical Therapy

## 2021-10-26 ENCOUNTER — Encounter: Payer: Self-pay | Admitting: Physical Therapy

## 2021-10-26 DIAGNOSIS — M542 Cervicalgia: Secondary | ICD-10-CM | POA: Diagnosis not present

## 2021-10-26 NOTE — Therapy (Unsigned)
[facility] [address], [place], [address] Phone: [phone]   Fax:  [phone]  Physical Therapy Treatment/DC Summary Reporting Period 09/08/21 - 10/26/21  Patient Details  Name: Julie Shelton MRN: [id number] Date of Birth: [birth year] No data recorded  Encounter Date: 10/26/2021   PT End of Session - 10/28/21 0912     Number of Visits 16    Date for PT Re-Evaluation 11/03/21    Authorization Type UHC Medicare    Authorization Time Period of 10 progress report (06/20/2018)    Authorization - Number of Visits 6    Progress Note Due on Visit 10    PT Start Time 1345    PT Stop Time 1420    PT Time Calculation (min) 35 min    Activity Tolerance Patient tolerated treatment well    Behavior During Therapy WFL for tasks assessed/performed              Past Medical History:  Diagnosis Date   Allergic rhinitis    Chronic pain    Depression    GERD (gastroesophageal reflux disease)    Hypothyroidism    Torticollis    Vitamin D deficiency     Past Surgical History:  Procedure Laterality Date   AUGMENTATION MAMMAPLASTY     Removed 5 years ago    BREAST ENHANCEMENT SURGERY     breast implants removed     LAPAROSCOPIC [place]     2003    There were no vitals filed for this visit.   Subjective Assessment - 10/28/21 0912     Subjective Pt feels like her neck is not getting any better. Has appt with neurology. Reports her pain is 8/10. compliance with HEP    Pertinent History Cervicalgia, cervical dystonia. Pt currently wearing a heart monitor as well. Had cervical fusion about 10-12 years ago due to neck pain.  Had very minimal torticolis prior to her cervical surgery. However after her neck surgery, her torticolis became more prominent.  Did not have PT right after the neck surgery. Had PT for her neck 10 years after the surgery.   Had PT for her neck a few years ago which helped.  Difficult to do her exercises at home.  Had soft tissue work on her neck with PT.  Pt feels tight muscles towards the L side of the back of her neck.  Pt states that her head keeps turning to her L.  The clonopine does not really help with her muscle spasms in her neck.  Feels like her neck is squishing together.     Patient Stated Goals Want to be able to drive better (look around), be able to garden/yard work, clean her house.     Pain Onset More than a month ago                Ther-Ex  PT reviewed the following HEP with patient with patient able to demonstrate a set of the following with min cuing for correction needed. PT educated patient on parameters of therex (how/when to inc/decrease intensity, frequency, rep/set range, stretch hold time, and purpose of therex) with verbalized understanding.   - Seated Cervical Sidebending Stretch  - 2-3 x daily - 7 x weekly - 30-60sec hold - Seated Levator Scapulae Stretch  - 2-3 x daily - 7 x weekly - 30-60sec hold -  Sternocleidomastoid Stretch  - 2-3 x daily - 7 x weekly - 30-60sec hold                          PT Short Term Goals - 06/20/18 1803       PT SHORT TERM GOAL #1   Title Patient will be independent with her HEP to decrease pain, improve function.     Baseline Pt has started her HEP (06/20/2018)    Time 3    Period Weeks    Status New    Target Date 07/13/18               PT Long Term Goals - 10/26/21 1356       PT LONG TERM GOAL #1   Title Patient will have a decrease in neck pain to 4/10 or less at worst to promote ability to look around, perform chores, drive more comfortably.     Baseline 8/10 neck pain at worst (06/20/2018); 10/26/21 8/10 still    Time 8    Period Weeks    Status Not Met                    Patient will benefit from skilled therapeutic intervention in order to improve the following deficits and impairments:   Pain, Postural dysfunction, Improper body mechanics, Increased muscle spasms, Decreased strength, Decreased range of motion  Visit Diagnosis: Cervicalgia     Problem List Patient Active Problem List   Diagnosis Date Noted   Left leg paresthesias 09/08/2020   Sensorineural hearing loss (SNHL) of left ear 11/12/2019   Dizziness after extension of neck 11/12/2019   Exertional dyspnea 06/17/2018   Palpitations 06/17/2018   Other fatigue 06/17/2018   Hearing loss 05/24/2018   Cervical dystonia 04/24/2014   Cervicalgia 05/31/2013   Torticollis    Depression    GERD (gastroesophageal reflux disease)    Hypothyroidism    Allergic rhinitis    Chronic pain    Vitamin D deficiency    Durwin Reges DPT  Durwin Reges, PT 10/28/2021, 9:16 AM  [facility] [address], [place], [address] Phone: [phone]   Fax:  [phone]  Name: Julie Shelton MRN: [id number] Date of Birth: 1941-04-08

## 2021-10-28 ENCOUNTER — Ambulatory Visit: Payer: Medicare Other | Admitting: Speech Pathology

## 2021-10-28 ENCOUNTER — Encounter: Payer: Medicare Other | Admitting: Speech Pathology

## 2021-10-29 ENCOUNTER — Ambulatory Visit: Payer: Medicare Other | Admitting: Physical Therapy

## 2021-11-02 ENCOUNTER — Ambulatory Visit: Payer: Medicare Other | Admitting: Physical Therapy

## 2021-11-03 ENCOUNTER — Ambulatory Visit: Payer: Medicare Other | Admitting: Speech Pathology

## 2021-11-04 ENCOUNTER — Encounter: Payer: Medicare Other | Admitting: Physical Therapy

## 2021-11-09 ENCOUNTER — Encounter: Payer: Medicare Other | Admitting: Speech Pathology

## 2021-11-09 ENCOUNTER — Encounter: Payer: Medicare Other | Admitting: Physical Therapy

## 2021-11-11 ENCOUNTER — Encounter: Payer: Medicare Other | Admitting: Physical Therapy

## 2021-11-11 ENCOUNTER — Encounter: Payer: Medicare Other | Admitting: Speech Pathology

## 2021-11-17 ENCOUNTER — Encounter: Payer: Medicare Other | Admitting: Speech Pathology

## 2021-11-19 ENCOUNTER — Encounter: Payer: Medicare Other | Admitting: Speech Pathology

## 2021-11-24 ENCOUNTER — Encounter: Payer: Medicare Other | Admitting: Speech Pathology

## 2021-11-26 ENCOUNTER — Encounter: Payer: Medicare Other | Admitting: Speech Pathology

## 2021-12-01 ENCOUNTER — Encounter: Payer: Medicare Other | Admitting: Speech Pathology

## 2021-12-03 ENCOUNTER — Encounter: Payer: Medicare Other | Admitting: Speech Pathology

## 2021-12-08 ENCOUNTER — Encounter: Payer: Medicare Other | Admitting: Speech Pathology

## 2021-12-10 ENCOUNTER — Encounter: Payer: Medicare Other | Admitting: Speech Pathology

## 2021-12-15 ENCOUNTER — Encounter: Payer: Medicare Other | Admitting: Speech Pathology

## 2021-12-17 ENCOUNTER — Encounter: Payer: Medicare Other | Admitting: Speech Pathology

## 2021-12-22 ENCOUNTER — Encounter: Payer: Medicare Other | Admitting: Speech Pathology

## 2021-12-29 ENCOUNTER — Encounter: Payer: Medicare Other | Admitting: Speech Pathology

## 2021-12-31 ENCOUNTER — Encounter: Payer: Medicare Other | Admitting: Speech Pathology

## 2022-01-05 ENCOUNTER — Encounter: Payer: Medicare Other | Admitting: Speech Pathology

## 2022-01-07 ENCOUNTER — Encounter: Payer: Medicare Other | Admitting: Speech Pathology

## 2022-01-12 ENCOUNTER — Encounter: Payer: Medicare Other | Admitting: Speech Pathology

## 2022-01-14 ENCOUNTER — Encounter: Payer: Medicare Other | Admitting: Speech Pathology

## 2022-04-06 ENCOUNTER — Other Ambulatory Visit: Payer: Self-pay | Admitting: Internal Medicine

## 2022-04-06 DIAGNOSIS — M8589 Other specified disorders of bone density and structure, multiple sites: Secondary | ICD-10-CM

## 2022-04-06 DIAGNOSIS — Z1231 Encounter for screening mammogram for malignant neoplasm of breast: Secondary | ICD-10-CM

## 2022-05-18 ENCOUNTER — Ambulatory Visit: Payer: Medicare Other | Admitting: Dermatology

## 2022-06-03 ENCOUNTER — Ambulatory Visit
Admission: RE | Admit: 2022-06-03 | Discharge: 2022-06-03 | Disposition: A | Discharge status: Home / Self Care | Payer: Medicare Other | Source: Ambulatory Visit | Attending: Internal Medicine | Admitting: Internal Medicine

## 2022-06-03 DIAGNOSIS — Z1231 Encounter for screening mammogram for malignant neoplasm of breast: Secondary | ICD-10-CM

## 2022-08-05 DIAGNOSIS — E039 Hypothyroidism, unspecified: Secondary | ICD-10-CM | POA: Diagnosis not present

## 2022-08-05 DIAGNOSIS — Z0000 Encounter for general adult medical examination without abnormal findings: Secondary | ICD-10-CM | POA: Diagnosis not present

## 2022-08-05 DIAGNOSIS — M8589 Other specified disorders of bone density and structure, multiple sites: Secondary | ICD-10-CM | POA: Diagnosis not present

## 2022-08-05 DIAGNOSIS — G243 Spasmodic torticollis: Secondary | ICD-10-CM | POA: Diagnosis not present

## 2022-08-05 DIAGNOSIS — E559 Vitamin D deficiency, unspecified: Secondary | ICD-10-CM | POA: Diagnosis not present

## 2022-08-05 DIAGNOSIS — Z1211 Encounter for screening for malignant neoplasm of colon: Secondary | ICD-10-CM | POA: Diagnosis not present

## 2022-08-05 DIAGNOSIS — K219 Gastro-esophageal reflux disease without esophagitis: Secondary | ICD-10-CM | POA: Diagnosis not present

## 2022-08-05 DIAGNOSIS — E7800 Pure hypercholesterolemia, unspecified: Secondary | ICD-10-CM | POA: Diagnosis not present

## 2022-08-24 ENCOUNTER — Telehealth: Payer: Self-pay

## 2022-08-24 ENCOUNTER — Ambulatory Visit: Payer: Medicare Other | Admitting: Neurology

## 2022-08-24 ENCOUNTER — Encounter: Payer: Self-pay | Admitting: Neurology

## 2022-08-24 VITALS — BP 126/70 | Ht 69.0 in | Wt 162.0 lb

## 2022-08-24 DIAGNOSIS — G243 Spasmodic torticollis: Secondary | ICD-10-CM | POA: Diagnosis not present

## 2022-08-24 NOTE — Progress Notes (Signed)
Chief Complaint  Patient presents with   Follow-up    Rm 14, alone, f/u cervical dystonia, feels like it is getting worse, having less range of motion in neck. Does have new RX for hydrocodone from Dr. Margaretann Loveless but not helpful       ASSESSMENT AND PLAN  [name] is a 82 y.o. female  Cervical dystonia   She has moderate retrocollis, moderate right tilt, moderate left turn, slight left shoulder elevation, severe limited range of motion looking to the right,  mild sagittal anterior shift,    Only temporary mild improvement with previous injection, using Botox or xeomin up to 400 units, last injection was on Sep 08 2020.  Will try Dysport 500 units x2       DIAGNOSTIC DATA (LABS, IMAGING, TESTING) - I reviewed patient records, labs, notes, testing and imaging myself where available.  MRI of brain w/wo December 05 2019. No acute abnormality.  No cause for hearing loss identified.   Atrophy with mild white matter changes consistent with microvascular Ischemia.  MRA of neck was negative.  MRA of head No intracranial stenosis or large vessel occlusion   2 mm outpouching at the origin of the posterior communicating artery bilaterally compatible with infundibulum.  2015: MRI cervical spine (without) demonstrating:  1. At C3-4 uncovertebral joint hypertrophy and facet hypertrophy with mild  right foraminal stenosis  2. At C4-5: uncovertebral joint hypertrophy and facet hypertrophy with no  spinal stenosis or foraminal narrowing  3. Anterior discectomy and fusion at C5, C6 and C7 with anterior vertebral  body screws and metal  4. Compared to prior MRI from 05/02/04, the post-surgical changes are new  findings. Also, the degenerative changes at C3-4 and C4-5 have slightly  progressed.   HISTORICAL  Julie Shelton is a 82 year old female, seen in request by her primary care physician gates, [name], to continue with care of her cervical dystonia,  I reviewed and summarized  the referring note. Hypothyroidism, on supplement   She was diagnosed with cervical dystonia around 2008 at [facility]   Around 2004, wiithout clear trigger, she had gradual onset of head pulling to her left shoulder, difficulty turning her neck to either direction especially to her left, later she also developed shooting pain from her left cervical region down to her left shoulder, numbness involving left fifth finger   She had MRI of cervical in 2006 at [facility] imaging, which showed loss of height with left eccentric disc osteophyte complex at C5-6, and C6 and 7, with mild central canal narrowing, minimum left foraminal narrowing at C6 and 7, repeat MRI of cervical in 2007, a year later, continued to demonstrate spondylosis at C5-6, C6 and 7, with endplate osteophyte, covering bulging disc material, mild canal stenosis, no cord compression, there is no significant foraminal stenosis.   However with her persistent left-sided neck pain, shooting pain to her left shoulder, she underwent anterior cervical discectomy, and fusion at C5-6, C6 and 7 with tricortical autograft, and plating with a short statue synthetic system C5-C7 by Gala Lewandowsky, Ranjan at [facility] in August 2008, this was done at [place], [place],   She only has minimum improvement of her neck pain postsurgically, eventually she was evaluated by [facility] in October 2008, was correctly diagnosed with spasmodic torticollis, has received repeat EMG guided Botox injection under supervision of Dr. Jeremy Johann, she was receiving injections every 3-5 months, last injection was April 2,2014, she reported mild to moderate improvement with each  injection, due to insurance change, she could no longer go to [facility] for continued treatment. She desires to transfer her injection locally.   She denied any significant side effect was Botox injection, was not sure about the dosage, but reported continued mild to moderate improvement with each  injection, over the past few months, because prolonged interval with the last injection, she noticed worsening neck pain, difficulty turning her neck, and the worsening abnormal posture.   She is also taking clonazepam 0.5 mg twice a day, she has significant neck pain, 8 out of 10, which has put limitation on her daily activity,  She denies gait difficulty, no bilateral arm persistent sensory loss, all weakness,   I started to give her her  EMG guided Xeomin injection in Feb 11th 2015, no significant side effect noticed either.  UPDATE Aug 24 2022: Over the past many years, she was given Xeomin, Botox up to 400 units without helping her symptoms,   Last injection was in May 2022, returns today complaining of worsening neck issues, forceful turning towards the left side, left posterior neck muscle tensed up, difficulty looking towards right side, affecting her gait, she enjoys gardening   MRI of the brain w/wo in August 2021, no acute abnormality MRA of the neck no large vessel disease MRA of brain incidental 2 mm outpouching of the origin at the posterior communicating artery bilaterally with infundibulum  She reported improvement with Xanax1 mg 2 tablets as needed, which was given as premedication for MRI, after Xanax, she can move her neck much better,  She was given clonazepam 0.5 mg as needed, prescription was from her primary care physician, no significant improvement noted    PHYSICAL EXAM:   Vitals:   08/24/22 0959  BP: 126/70  Weight: 162 lb (73.5 kg)  Height: 5\' 9"  (1.753 m)   Body mass index is 23.92 kg/m.  PHYSICAL EXAM   Vitals:   01/09/20 1500  BP: 138/80  Pulse: 79  Weight: 180 lb 8 oz (81.9 kg)  Height: 5\' 9"  (1.753 m)   Body mass index is 26.66 kg/m.   PHYSICAL EXAMNIATION:  Gen: NAD, conversant, well nourised, well groomed                     Cardiovascular: Regular rate rhythm, no peripheral edema, warm, nontender. Eyes: Conjunctivae clear without  exudates or hemorrhage Neck: Supple, no carotid bruits. Pulmonary: Clear to auscultation bilaterally   NEUROLOGICAL EXAM: PHYSICAL EXAMNIATION: She has moderate retrocollis, moderate right tilt, moderate left turn, slight left shoulder elevation, severe limited range of motion looking to the right or left, mild sagittal anterior shift, frequent no no head shaking,    MENTAL STATUS: Speech/Cognition: Awake, alert, normal speech, oriented to history taking and casual conversation.  CRANIAL NERVES: CN II: Visual fields are full to confrontation.  Pupils are round equal and briskly reactive to light. CN III, IV, VI: extraocular movement are normal. No ptosis. CN V: Facial sensation is intact to light touch. CN VII: Face is symmetric with normal eye closure and smile. CN VIII: Hearing is normal to casual conversation CN IX, X: Palate elevates symmetrically. Phonation is normal. CN XI: Head turning and shoulder shrug are intact CN XII: Tongue is midline with normal movements and no atrophy.  MOTOR: Muscle bulk and tone are normal. Muscle strength is normal.  REFLEXES: Reflexes are 2  and symmetric at the biceps, triceps, knees and ankles. Plantar responses are flexor.  SENSORY: Intact to  light touch, pinprick, positional and vibratory sensation at fingers and toes.  COORDINATION: There is no trunk or limb ataxia.    GAIT/STANCE: She needs push-up to get up from seated position, neck tilted towards her right shoulder, chin turning towards the left side, mild retrocollis, mild left shoulder elevation  REVIEW OF SYSTEMS:  Full 14 system review of systems performed and notable only for as above All other review of systems were negative.   ALLERGIES: No Known Allergies  HOME MEDICATIONS: Current Outpatient Medications  Medication Sig Dispense Refill   DULoxetine (CYMBALTA) 30 MG capsule Take 30 mg by mouth daily.     HYDROcodone-acetaminophen (NORCO/VICODIN) 5-325 MG tablet  Take 1 tablet by mouth as needed for moderate pain (every 12 hours).     ibuprofen (ADVIL,MOTRIN) 200 MG tablet Take 200 mg by mouth every 6 (six) hours as needed.     levothyroxine (SYNTHROID, LEVOTHROID) 75 MCG tablet Take 75 mcg by mouth daily before breakfast.      botulinum toxin Type A (BOTOX) 100 units SOLR injection Inject 400 Units into the muscle every 3 (three) months. (Patient not taking: Reported on 09/08/2021)     cetirizine (ZYRTEC) 10 MG chewable tablet Chew 10 mg by mouth as needed.  (Patient not taking: Reported on 08/24/2022)     No current facility-administered medications for this visit.    PAST MEDICAL HISTORY: Past Medical History:  Diagnosis Date   Allergic rhinitis    Chronic pain    Depression    GERD (gastroesophageal reflux disease)    Hypothyroidism    Torticollis    Vitamin D deficiency     PAST SURGICAL HISTORY: Past Surgical History:  Procedure Laterality Date   AUGMENTATION MAMMAPLASTY     Removed 5 years ago    BREAST ENHANCEMENT SURGERY     breast implants removed     LAPAROSCOPIC TUBAL LIGATION     NECK SURGERY     SHOULDER OPEN ROTATOR CUFF REPAIR     2003    FAMILY HISTORY: Family History  Problem Relation Age of Onset   Thyroid disease Mother    Dementia Father     SOCIAL HISTORY: Social History   Socioeconomic History   Marital status: Married    Spouse name: Dorma Russell   Number of children: 2   Years of education: 12   Highest education level: Not on file  Occupational History    Employer: RETIRED    Comment: retired  Tobacco Use   Smoking status: Former    Types: Cigarettes   Smokeless tobacco: Never   Tobacco comments:    Quit in 1972  Vaping Use   Vaping Use: Never used  Substance and Sexual Activity   Alcohol use: Yes    Comment: social-beer   Drug use: No   Sexual activity: Not on file  Other Topics Concern   Not on file  Social History Narrative   Right handed   Patient lives at home with her husband Dorma Russell)  .   Patient is retired.    Education- High school    Caffeine- two-three cups daily.   Social Determinants of Health   Financial Resource Strain: Not on file  Food Insecurity: Not on file  Transportation Needs: Not on file  Physical Activity: Not on file  Stress: Not on file  Social Connections: Not on file  Intimate Partner Violence: Not on file      Levert Feinstein, M.D. Ph.D.  [facility] [address],  [address] [place], [place] [address] Ph: [phone] Fax: [phone]  CC:  Marden Noble, MD [address] [place],  [place] [address]  Marden Noble, MD

## 2022-08-24 NOTE — Telephone Encounter (Signed)
PA needed for dysport (J0586) 500 units x2 EMG guidance ([address]) CPT 64616 G24.3, M43.6

## 2022-08-26 ENCOUNTER — Telehealth: Payer: Self-pay

## 2022-08-26 NOTE — Telephone Encounter (Signed)
A new telephone call encounter has been made for this PA Request-please see telephone note dated 08/26/2022. 

## 2022-08-26 NOTE — Telephone Encounter (Signed)
     Approved-Buy and Bill 

## 2022-09-14 DIAGNOSIS — G243 Spasmodic torticollis: Secondary | ICD-10-CM | POA: Diagnosis not present

## 2022-09-27 ENCOUNTER — Ambulatory Visit
Admission: RE | Admit: 2022-09-27 | Discharge: 2022-09-27 | Disposition: A | Discharge status: Home / Self Care | Payer: Medicare Other | Source: Ambulatory Visit | Attending: Internal Medicine | Admitting: Internal Medicine

## 2022-09-27 DIAGNOSIS — R29890 Loss of height: Secondary | ICD-10-CM | POA: Diagnosis not present

## 2022-09-27 DIAGNOSIS — E349 Endocrine disorder, unspecified: Secondary | ICD-10-CM | POA: Diagnosis not present

## 2022-09-27 DIAGNOSIS — E039 Hypothyroidism, unspecified: Secondary | ICD-10-CM | POA: Diagnosis not present

## 2022-09-27 DIAGNOSIS — M810 Age-related osteoporosis without current pathological fracture: Secondary | ICD-10-CM | POA: Diagnosis not present

## 2022-09-27 DIAGNOSIS — M8589 Other specified disorders of bone density and structure, multiple sites: Secondary | ICD-10-CM

## 2022-10-07 ENCOUNTER — Ambulatory Visit: Payer: Medicare Other | Admitting: Neurology

## 2022-10-07 VITALS — BP 142/88 | HR 63

## 2022-10-07 DIAGNOSIS — G243 Spasmodic torticollis: Secondary | ICD-10-CM

## 2022-10-07 MED ORDER — ABOBOTULINUMTOXINA 500 UNITS IM SOLR
1000.0000 [IU] | Freq: Once | INTRAMUSCULAR | Status: AC
Start: 2022-10-07 — End: 2022-10-07
  Administered 2022-10-07: 1000 [IU] via INTRAMUSCULAR

## 2022-10-07 NOTE — Progress Notes (Signed)
Dysport 500units x 2 vial  [id number] [id number] Exp-01.31.2026 B/B Bacteriostatic 0.9% Sodium Chloride- 5 mL  Lot: [id number] Expiration: 04.01.25 NDC: [id number] G24.3 Witnessed by April, RN

## 2022-10-07 NOTE — Progress Notes (Signed)
Chief Complaint  Patient presents with   Procedure    Rm12 alone, pt is well and ready for botox injection      ASSESSMENT AND PLAN  Julie Shelton is a 82 y.o. female  Cervical dystonia   She has moderate retrocollis, moderate right tilt, moderate left turn, slight left shoulder elevation, severe limited range of motion looking to the right,  mild sagittal anterior shift,    Only temporary mild improvement with previous injection, using Botox or xeomin up to 400 units, last injection was on Sep 08 2020.   EMG guided Dysport injection 500 unitsx2(500 units of Dysport/was dissolved into 2.5 cc of normal saline)  Left splenius cervix 0.5 cc x 3=1.5 cc Left splenius capitis 0.5 ccx 3=1.5 cc Left longissimus capitis 0.5 cc Left levator scapular 0.5 cc Left inferior oblique capitis 0.5 cc  Right semispinalis 0.5 cc   DIAGNOSTIC DATA (LABS, IMAGING, TESTING) - I reviewed patient records, labs, notes, testing and imaging myself where available.  MRI of brain w/wo December 05 2019. No acute abnormality.  No cause for hearing loss identified.   Atrophy with mild white matter changes consistent with microvascular Ischemia.  MRA of neck was negative.  MRA of head No intracranial stenosis or large vessel occlusion   2 mm outpouching at the origin of the posterior communicating artery bilaterally compatible with infundibulum.  2015: MRI cervical spine (without) demonstrating:  1. At C3-4 uncovertebral joint hypertrophy and facet hypertrophy with mild  right foraminal stenosis  2. At C4-5: uncovertebral joint hypertrophy and facet hypertrophy with no  spinal stenosis or foraminal narrowing  3. Anterior discectomy and fusion at C5, C6 and C7 with anterior vertebral  body screws and metal  4. Compared to prior MRI from 05/02/04, the post-surgical changes are new  findings. Also, the degenerative changes at C3-4 and C4-5 have slightly  progressed.   HISTORICAL  Julie Shelton is a 82 year old female, seen in request by her primary care physician gates, [name], to continue with care of her cervical dystonia,  I reviewed and summarized the referring note. Hypothyroidism, on supplement   She was diagnosed with cervical dystonia around 2008 at [facility]   Around 2004, wiithout clear trigger, she had gradual onset of head pulling to her left shoulder, difficulty turning her neck to either direction especially to her left, later she also developed shooting pain from her left cervical region down to her left shoulder, numbness involving left fifth finger   She had MRI of cervical in 2006 at [facility] imaging, which showed loss of height with left eccentric disc osteophyte complex at C5-6, and C6 and 7, with mild central canal narrowing, minimum left foraminal narrowing at C6 and 7, repeat MRI of cervical in 2007, a year later, continued to demonstrate spondylosis at C5-6, C6 and 7, with endplate osteophyte, covering bulging disc material, mild canal stenosis, no cord compression, there is no significant foraminal stenosis.   However with her persistent left-sided neck pain, shooting pain to her left shoulder, she underwent anterior cervical discectomy, and fusion at C5-6, C6 and 7 with tricortical autograft, and plating with a short statue synthetic system C5-C7 by Dr.Roy, Ranjan at [facility] in August 2008, this was done at [place], [place],   She only has minimum improvement of her neck pain postsurgically, eventually she was evaluated by [facility] in October 2008, was correctly diagnosed with spasmodic torticollis, has received repeat EMG guided Botox injection under supervision of Dr.  Jeremy Johann, she was receiving injections every 3-5 months, last injection was April 2,2014, she reported mild to moderate improvement with each injection, due to insurance change, she could no longer go to [facility] for continued treatment. She desires to transfer her  injection locally.   She denied any significant side effect was Botox injection, was not sure about the dosage, but reported continued mild to moderate improvement with each injection, over the past few months, because prolonged interval with the last injection, she noticed worsening neck pain, difficulty turning her neck, and the worsening abnormal posture.   She is also taking clonazepam 0.5 mg twice a day, she has significant neck pain, 8 out of 10, which has put limitation on her daily activity,  She denies gait difficulty, no bilateral arm persistent sensory loss, all weakness,   I started to give her her  EMG guided Xeomin injection in Feb 11th 2015, no significant side effect noticed either.  UPDATE Aug 24 2022: Over the past many years, she was given Xeomin, Botox up to 400 units without helping her symptoms,   Last injection was in May 2022, returns today complaining of worsening neck issues, forceful turning towards the left side, left posterior neck muscle tensed up, difficulty looking towards right side, affecting her gait, she enjoys gardening   MRI of the brain w/wo in August 2021, no acute abnormality MRA of the neck no large vessel disease MRA of brain incidental 2 mm outpouching of the origin at the posterior communicating artery bilaterally with infundibulum  She reported improvement with Xanax1 mg 2 tablets as needed, which was given as premedication for MRI, after Xanax, she can move her neck much better,  She was given clonazepam 0.5 mg as needed, prescription was from her primary care physician, no significant improvement noted  UPDATE October 07 2022: Slow worsening cervical dystonia over the years, she complains of deep achy pain of neck muscles, especially on the left side, tendency for neck pulling backwards, forceful turning towards the left side, we are using Dysport 500 units x 2 today, risk and benefit, potential side effect explained, consent form signed,  PHYSICAL  EXAM:   Vitals:   10/07/22 1558  BP: (!) 142/88  Pulse: 63   There is no height or weight on file to calculate BMI.  PHYSICAL EXAM   Vitals:   01/09/20 1500  BP: 138/80  Pulse: 79  Weight: 180 lb 8 oz (81.9 kg)  Height: 5\' 9"  (1.753 m)   Body mass index is 26.66 kg/m.   PHYSICAL EXAMNIATION:  Gen: NAD, conversant, well nourised, well groomed                     Cardiovascular: Regular rate rhythm, no peripheral edema, warm, nontender. Eyes: Conjunctivae clear without exudates or hemorrhage Neck: Supple, no carotid bruits. Pulmonary: Clear to auscultation bilaterally   NEUROLOGICAL EXAM: PHYSICAL EXAMNIATION: She has moderate retrocollis, moderate right tilt, moderate left turn, slight left shoulder elevation, severe limited range of motion looking to the right or left, mild sagittal anterior shift, frequent no no head shaking, significant hypertrophy of left posterior cervical muscles   MENTAL STATUS: Speech/Cognition: Awake, alert, normal speech, oriented to history taking and casual conversation.  CRANIAL NERVES: CN II: Visual fields are full to confrontation.  Pupils are round equal and briskly reactive to light. CN III, IV, VI: extraocular movement are normal. No ptosis. CN V: Facial sensation is intact to light touch. CN VII: Face is  symmetric with normal eye closure and smile. CN VIII: Hearing is normal to casual conversation CN IX, X: Palate elevates symmetrically. Phonation is normal. CN XI: Head turning and shoulder shrug are intact CN XII: Tongue is midline with normal movements and no atrophy.  MOTOR: Muscle bulk and tone are normal. Muscle strength is normal.  REFLEXES: Reflexes are 2  and symmetric at the biceps, triceps, knees and ankles. Plantar responses are flexor.  SENSORY: Intact to light touch, pinprick, positional and vibratory sensation at fingers and toes.  COORDINATION: There is no trunk or limb ataxia.    GAIT/STANCE: She needs  push-up to get up from seated position, neck tilted towards her right shoulder, chin turning towards the left side, mild retrocollis, mild left shoulder elevation  REVIEW OF SYSTEMS:  Full 14 system review of systems performed and notable only for as above All other review of systems were negative.   ALLERGIES: No Known Allergies  HOME MEDICATIONS: Current Outpatient Medications  Medication Sig Dispense Refill   botulinum toxin Type A (BOTOX) 100 units SOLR injection Inject 400 Units into the muscle every 3 (three) months.     cetirizine (ZYRTEC) 10 MG chewable tablet Chew 10 mg by mouth as needed.      DULoxetine (CYMBALTA) 30 MG capsule Take 30 mg by mouth daily.     HYDROcodone-acetaminophen (NORCO/VICODIN) 5-325 MG tablet Take 1 tablet by mouth as needed for moderate pain (every 12 hours).     ibuprofen (ADVIL,MOTRIN) 200 MG tablet Take 200 mg by mouth every 6 (six) hours as needed.     levothyroxine (SYNTHROID, LEVOTHROID) 75 MCG tablet Take 75 mcg by mouth daily before breakfast.      Current Facility-Administered Medications  Medication Dose Route Frequency Provider Last Rate Last Admin   AbobotulinumtoxinA (DYSPORT) 500 units injection 1,000 Units  1,000 Units Intramuscular Once Levert Feinstein, MD        PAST MEDICAL HISTORY: Past Medical History:  Diagnosis Date   Allergic rhinitis    Chronic pain    Depression    GERD (gastroesophageal reflux disease)    Hypothyroidism    Torticollis    Vitamin D deficiency     PAST SURGICAL HISTORY: Past Surgical History:  Procedure Laterality Date   AUGMENTATION MAMMAPLASTY     Removed 5 years ago    BREAST ENHANCEMENT SURGERY     breast implants removed     LAPAROSCOPIC TUBAL LIGATION     NECK SURGERY     SHOULDER OPEN ROTATOR CUFF REPAIR     2003    FAMILY HISTORY: Family History  Problem Relation Age of Onset   Thyroid disease Mother    Dementia Father     SOCIAL HISTORY: Social History   Socioeconomic History    Marital status: Married    Spouse name: Dorma Russell   Number of children: 2   Years of education: 12   Highest education level: Not on file  Occupational History    Employer: RETIRED    Comment: retired  Tobacco Use   Smoking status: Former    Types: Cigarettes   Smokeless tobacco: Never   Tobacco comments:    Quit in 1972  Vaping Use   Vaping Use: Never used  Substance and Sexual Activity   Alcohol use: Yes    Comment: social-beer   Drug use: No   Sexual activity: Not on file  Other Topics Concern   Not on file  Social History Narrative   Right handed  Patient lives at home with her husband Dorma Russell) .   Patient is retired.    Education- High school    Caffeine- two-three cups daily.   Social Determinants of Health   Financial Resource Strain: Not on file  Food Insecurity: Not on file  Transportation Needs: Not on file  Physical Activity: Not on file  Stress: Not on file  Social Connections: Not on file  Intimate Partner Violence: Not on file      Levert Feinstein, M.D. Ph.D.  [facility] [address], [address] [place], [place] [address] Ph: [phone] Fax: [phone]  CC:  Marden Noble, MD [address] [place],  [place] [address]  Thana Ates, MD

## 2022-12-21 DIAGNOSIS — R12 Heartburn: Secondary | ICD-10-CM | POA: Diagnosis not present

## 2022-12-21 DIAGNOSIS — R110 Nausea: Secondary | ICD-10-CM | POA: Diagnosis not present

## 2022-12-21 DIAGNOSIS — R1011 Right upper quadrant pain: Secondary | ICD-10-CM | POA: Diagnosis not present

## 2022-12-21 DIAGNOSIS — R634 Abnormal weight loss: Secondary | ICD-10-CM | POA: Diagnosis not present

## 2022-12-21 DIAGNOSIS — R1031 Right lower quadrant pain: Secondary | ICD-10-CM | POA: Diagnosis not present

## 2022-12-22 ENCOUNTER — Other Ambulatory Visit: Payer: Self-pay | Admitting: Nurse Practitioner

## 2022-12-22 DIAGNOSIS — R110 Nausea: Secondary | ICD-10-CM

## 2022-12-22 DIAGNOSIS — R1031 Right lower quadrant pain: Secondary | ICD-10-CM

## 2022-12-22 DIAGNOSIS — R634 Abnormal weight loss: Secondary | ICD-10-CM

## 2022-12-22 DIAGNOSIS — R1011 Right upper quadrant pain: Secondary | ICD-10-CM

## 2022-12-23 ENCOUNTER — Ambulatory Visit
Admission: RE | Admit: 2022-12-23 | Discharge: 2022-12-23 | Disposition: A | Discharge status: Home / Self Care | Payer: Medicare Other | Source: Ambulatory Visit | Attending: Nurse Practitioner | Admitting: Nurse Practitioner

## 2022-12-23 DIAGNOSIS — R634 Abnormal weight loss: Secondary | ICD-10-CM | POA: Diagnosis not present

## 2022-12-23 DIAGNOSIS — R109 Unspecified abdominal pain: Secondary | ICD-10-CM | POA: Diagnosis not present

## 2022-12-23 DIAGNOSIS — R1031 Right lower quadrant pain: Secondary | ICD-10-CM | POA: Diagnosis not present

## 2022-12-23 DIAGNOSIS — R12 Heartburn: Secondary | ICD-10-CM | POA: Diagnosis not present

## 2022-12-23 DIAGNOSIS — K5730 Diverticulosis of large intestine without perforation or abscess without bleeding: Secondary | ICD-10-CM | POA: Diagnosis not present

## 2022-12-23 DIAGNOSIS — R110 Nausea: Secondary | ICD-10-CM | POA: Insufficient documentation

## 2022-12-23 DIAGNOSIS — R1011 Right upper quadrant pain: Secondary | ICD-10-CM | POA: Diagnosis not present

## 2022-12-23 MED ORDER — IOHEXOL 300 MG/ML  SOLN
100.0000 mL | Freq: Once | INTRAMUSCULAR | Status: AC | PRN
  Administered 2022-12-23: 100 mL via INTRAVENOUS

## 2023-01-05 ENCOUNTER — Ambulatory Visit: Payer: Medicare Other | Admitting: Neurology

## 2023-01-13 ENCOUNTER — Ambulatory Visit: Payer: Medicare Other | Admitting: Neurology

## 2023-01-13 ENCOUNTER — Encounter: Payer: Self-pay | Admitting: Neurology

## 2023-01-13 VITALS — BP 138/79 | Resp 16 | Ht 69.0 in | Wt 158.0 lb

## 2023-01-13 DIAGNOSIS — M542 Cervicalgia: Secondary | ICD-10-CM | POA: Diagnosis not present

## 2023-01-13 DIAGNOSIS — R269 Unspecified abnormalities of gait and mobility: Secondary | ICD-10-CM | POA: Insufficient documentation

## 2023-01-13 DIAGNOSIS — G243 Spasmodic torticollis: Secondary | ICD-10-CM

## 2023-01-13 MED ORDER — TIZANIDINE HCL 4 MG PO TABS: 4.0000 mg | ORAL_TABLET | Freq: Three times a day (TID) | ORAL | 6 refills | Status: AC | PRN

## 2023-01-13 MED ORDER — ABOBOTULINUMTOXINA 500 UNITS IM SOLR
1000.0000 [IU] | Freq: Once | INTRAMUSCULAR | Status: AC
Start: 2023-01-13 — End: ?

## 2023-01-13 MED ORDER — DULOXETINE HCL 60 MG PO CPEP: 60.0000 mg | ORAL_CAPSULE | Freq: Every day | ORAL | 11 refills | Status: AC

## 2023-01-13 MED ORDER — ALPRAZOLAM 0.5 MG PO TABS: ORAL_TABLET | ORAL | 0 refills | Status: AC

## 2023-01-13 NOTE — Progress Notes (Signed)
Chief Complaint  Patient presents with   Procedure    Rm14,alone, Pt is well and ready for injection.       ASSESSMENT AND PLAN  Julie Shelton is a 82 y.o. female  Cervical dystonia   She has moderate retrocollis, moderate right tilt, moderate left turn, slight left shoulder elevation, severe limited range of motion looking to the right,  mild sagittal anterior shift,    Only temporary mild improvement with previous injection, using Botox or xeomin up to 400 units, last injection was on Sep 08 2020.   EMG guided Dysport injection 500 unitsx2(500 units of Dysport/was dissolved into 2.5 cc of normal saline)  Left splenius cervix 0.5 cc x 2=1.0 cc Left splenius capitis 0.5 ccx 3=1.5 cc Left longissimus capitis 0.5 cc Left levator scapular 0.5 cc Left inferior oblique capitis 0.5 cc  Right semispinalis 0.5 cc Right splenius cervix 0.5  She denies significant improvement with Dysport injection, fixed limited range of motion of her neck, increased gait abnormality, previous cervical decompression surgery, proceed with MRI of cervical spine, if she does not show improvement with this injection, will not continue   DIAGNOSTIC DATA (LABS, IMAGING, TESTING) - I reviewed patient records, labs, notes, testing and imaging myself where available.  MRI of brain w/wo December 05 2019. No acute abnormality.  No cause for hearing loss identified.   Atrophy with mild white matter changes consistent with microvascular Ischemia.  MRA of neck was negative.  MRA of head No intracranial stenosis or large vessel occlusion   2 mm outpouching at the origin of the posterior communicating artery bilaterally compatible with infundibulum.  2015: MRI cervical spine (without) demonstrating:  1. At C3-4 uncovertebral joint hypertrophy and facet hypertrophy with mild  right foraminal stenosis  2. At C4-5: uncovertebral joint hypertrophy and facet hypertrophy with no  spinal stenosis or foraminal  narrowing  3. Anterior discectomy and fusion at C5, C6 and C7 with anterior vertebral  body screws and metal  4. Compared to prior MRI from 05/02/04, the post-surgical changes are new  findings. Also, the degenerative changes at C3-4 and C4-5 have slightly  progressed.   HISTORICAL  Julie Shelton is a 82 year old female, seen in request by her primary care physician gates, [name], to continue with care of her cervical dystonia,  I reviewed and summarized the referring note. Hypothyroidism, on supplement   She was diagnosed with cervical dystonia around 2008 at [facility]   Around 2004, wiithout clear trigger, she had gradual onset of head pulling to her left shoulder, difficulty turning her neck to either direction especially to her left, later she also developed shooting pain from her left cervical region down to her left shoulder, numbness involving left fifth finger   She had MRI of cervical in 2006 at [facility] imaging, which showed loss of height with left eccentric disc osteophyte complex at C5-6, and C6 and 7, with mild central canal narrowing, minimum left foraminal narrowing at C6 and 7, repeat MRI of cervical in 2007, a year later, continued to demonstrate spondylosis at C5-6, C6 and 7, with endplate osteophyte, covering bulging disc material, mild canal stenosis, no cord compression, there is no significant foraminal stenosis.   However with her persistent left-sided neck pain, shooting pain to her left shoulder, she underwent anterior cervical discectomy, and fusion at C5-6, C6 and 7 with tricortical autograft, and plating with a short statue synthetic system C5-C7 by Dwaine Deter at [facility] in August 2008, this  was done at [place], [place],   She only has minimum improvement of her neck pain postsurgically, eventually she was evaluated by [facility] in October 2008, was correctly diagnosed with spasmodic torticollis, has received repeat EMG guided Botox injection  under supervision of Dr. Jeremy Johann, she was receiving injections every 3-5 months, last injection was April 2,2014, she reported mild to moderate improvement with each injection, due to insurance change, she could no longer go to [facility] for continued treatment. She desires to transfer her injection locally.   She denied any significant side effect was Botox injection, was not sure about the dosage, but reported continued mild to moderate improvement with each injection, over the past few months, because prolonged interval with the last injection, she noticed worsening neck pain, difficulty turning her neck, and the worsening abnormal posture.   She is also taking clonazepam 0.5 mg twice a day, she has significant neck pain, 8 out of 10, which has put limitation on her daily activity,  She denies gait difficulty, no bilateral arm persistent sensory loss, all weakness,   I started to give her her  EMG guided Xeomin injection in Feb 11th 2015, no significant side effect noticed either.  UPDATE Aug 24 2022: Over the past many years, she was given Xeomin, Botox up to 400 units without helping her symptoms,   Last injection was in May 2022, returns today complaining of worsening neck issues, forceful turning towards the left side, left posterior neck muscle tensed up, difficulty looking towards right side, affecting her gait, she enjoys gardening   MRI of the brain w/wo in August 2021, no acute abnormality MRA of the neck no large vessel disease MRA of brain incidental 2 mm outpouching of the origin at the posterior communicating artery bilaterally with infundibulum  She reported improvement with Xanax1 mg 2 tablets as needed, which was given as premedication for MRI, after Xanax, she can move her neck much better,  She was given clonazepam 0.5 mg as needed, prescription was from her primary care physician, no significant improvement noted  UPDATE October 07 2022: Slow worsening cervical dystonia over  the years, she complains of deep achy pain of neck muscles, especially on the left side, tendency for neck pulling backwards, forceful turning towards the left side, we are using Dysport 500 units x 2 today, risk and benefit, potential side effect explained, consent form signed,  UPDATE Sept 26th 2024; She denies significant improvement with previous Dysport 1000 units for her cervical dystonia, complains of worsening neck pain, difficulty walking, did have a history of cervical decompression surgery in the past, today's examination noted limited range of motion of her neck,  PHYSICAL EXAM:   Vitals:   01/13/23 1459  BP: 138/79  Resp: 16  Weight: 158 lb (71.7 kg)  Height: 5\' 9"  (1.753 m)   Body mass index is 23.33 kg/m.  PHYSICAL EXAM   Vitals:   01/09/20 1500  BP: 138/80  Pulse: 79  Weight: 180 lb 8 oz (81.9 kg)  Height: 5\' 9"  (1.753 m)   Body mass index is 26.66 kg/m.   PHYSICAL EXAMNIATION:  Gen: NAD, conversant, well nourised, well groomed                     Cardiovascular: Regular rate rhythm, no peripheral edema, warm, nontender. Eyes: Conjunctivae clear without exudates or hemorrhage Neck: Supple, no carotid bruits. Pulmonary: Clear to auscultation bilaterally   NEUROLOGICAL EXAM: PHYSICAL EXAMNIATION: She has moderate retrocollis, moderate  right tilt, moderate left turn, slight left shoulder elevation, severe limited range of motion looking to the right or left, mild sagittal anterior shift, frequent no no head shaking, significant hypertrophy of left posterior cervical muscles   MENTAL STATUS: Speech/Cognition: Awake, alert, normal speech, oriented to history taking and casual conversation.  CRANIAL NERVES: CN II: Visual fields are full to confrontation.  Pupils are round equal and briskly reactive to light. CN III, IV, VI: extraocular movement are normal. No ptosis. CN V: Facial sensation is intact to light touch. CN VII: Face is symmetric with normal eye  closure and smile. CN VIII: Hearing is normal to casual conversation CN IX, X: Palate elevates symmetrically. Phonation is normal. CN XI: Head turning and shoulder shrug are intact CN XII: Tongue is midline with normal movements and no atrophy.  MOTOR: Muscle bulk and tone are normal. Muscle strength is normal.  REFLEXES: Reflexes are 2  and symmetric at the biceps, triceps, knees and ankles. Plantar responses are flexor.  SENSORY: Intact to light touch, pinprick, positional and vibratory sensation at fingers and toes.  COORDINATION: There is no trunk or limb ataxia.    GAIT/STANCE: She needs push-up to get up from seated position, neck tilted towards her right shoulder, chin turning towards the left side, mild retrocollis, mild left shoulder elevation  REVIEW OF SYSTEMS:  Full 14 system review of systems performed and notable only for as above All other review of systems were negative.   ALLERGIES: No Known Allergies  HOME MEDICATIONS: Current Outpatient Medications  Medication Sig Dispense Refill   cetirizine (ZYRTEC) 10 MG chewable tablet Chew 10 mg by mouth as needed.      DULoxetine (CYMBALTA) 30 MG capsule Take 30 mg by mouth daily.     ibuprofen (ADVIL,MOTRIN) 200 MG tablet Take 200 mg by mouth every 6 (six) hours as needed.     levothyroxine (SYNTHROID, LEVOTHROID) 75 MCG tablet Take 75 mcg by mouth daily before breakfast.      Current Facility-Administered Medications  Medication Dose Route Frequency Provider Last Rate Last Admin   AbobotulinumtoxinA (DYSPORT) 500 units injection 1,000 Units  1,000 Units Intramuscular Once Levert Feinstein, MD        PAST MEDICAL HISTORY: Past Medical History:  Diagnosis Date   Allergic rhinitis    Chronic pain    Depression    GERD (gastroesophageal reflux disease)    Hypothyroidism    Torticollis    Vitamin D deficiency     PAST SURGICAL HISTORY: Past Surgical History:  Procedure Laterality Date   AUGMENTATION MAMMAPLASTY      Removed 5 years ago    BREAST ENHANCEMENT SURGERY     breast implants removed     LAPAROSCOPIC TUBAL LIGATION     NECK SURGERY     SHOULDER OPEN ROTATOR CUFF REPAIR     2003    FAMILY HISTORY: Family History  Problem Relation Age of Onset   Thyroid disease Mother    Dementia Father     SOCIAL HISTORY: Social History   Socioeconomic History   Marital status: Married    Spouse name: Dorma Russell   Number of children: 2   Years of education: 12   Highest education level: Not on file  Occupational History    Employer: RETIRED    Comment: retired  Tobacco Use   Smoking status: Former    Types: Cigarettes   Smokeless tobacco: Never   Tobacco comments:    Quit in 1972  Advertising account planner  Vaping status: Never Used  Substance and Sexual Activity   Alcohol use: Yes    Comment: social-beer   Drug use: No   Sexual activity: Not on file  Other Topics Concern   Not on file  Social History Narrative   Right handed   Patient lives at home with her husband Dorma Russell) .   Patient is retired.    Education- High school    Caffeine- two-three cups daily.   Social Determinants of Health   Financial Resource Strain: Not on file  Food Insecurity: Not on file  Transportation Needs: Not on file  Physical Activity: Not on file  Stress: Not on file  Social Connections: Not on file  Intimate Partner Violence: Not on file      Levert Feinstein, M.D. Ph.D.  [facility] [address], [address] [place], [place] [address] Ph: [phone] Fax: [phone]  CC:  Thana Ates, MD [address] [place],  [place] [address]  Thana Ates, MD

## 2023-01-13 NOTE — Progress Notes (Signed)
Dysport 500units x 2 vial  [phone] [id number] Exp-01.31.2026 B/B Bacteriostatic 0.9% Sodium Chloride- 5 mL  Lot: [id number] Expiration: 04.01.2025 NDC: 4270623762 Dx: G24.3 WITNESSED [id number], CMA

## 2023-01-14 ENCOUNTER — Telehealth: Payer: Self-pay | Admitting: Neurology

## 2023-01-14 NOTE — Telephone Encounter (Signed)
[employer] medicare NPR sent to GI [phone] 

## 2023-02-10 ENCOUNTER — Ambulatory Visit
Admission: RE | Admit: 2023-02-10 | Discharge: 2023-02-10 | Disposition: A | Discharge status: Home / Self Care | Payer: Medicare Other | Source: Ambulatory Visit | Attending: Neurology | Admitting: Neurology

## 2023-02-10 DIAGNOSIS — M542 Cervicalgia: Secondary | ICD-10-CM | POA: Diagnosis not present

## 2023-02-14 ENCOUNTER — Telehealth: Payer: Self-pay

## 2023-02-14 NOTE — Telephone Encounter (Signed)
-----   Message from Levert Feinstein sent at 02/11/2023  9:00 AM EDT ----- Please call patient, have cervical spine showed worsening degenerative changes of cervical spine, most noticeable at right C3-4 facet hypertrophy, arthrodesis, cause moderate foraminal narrowing  There are also milder degenerative changes at other levels.  Evidence of well-healed C5-C7 anterior decompression

## 2023-02-14 NOTE — Telephone Encounter (Signed)
Unable to leave msg constant ringing 1st attempt

## 2023-02-15 NOTE — Telephone Encounter (Signed)
Left msg 2nd attempt

## 2023-02-15 NOTE — Telephone Encounter (Signed)
Took incoming call and relayed results to patient. Pt verbalized understanding. Pt had no questions at this time but was encouraged to call back if questions arise. Patient stated she would be here for December appointment

## 2023-03-08 ENCOUNTER — Encounter: Payer: Self-pay | Admitting: Gastroenterology

## 2023-03-31 ENCOUNTER — Telehealth: Payer: Self-pay

## 2023-03-31 NOTE — Telephone Encounter (Signed)
Please let patient know, if she does not think previous injection was helpful, it is okay to cancel her December 19 injection, she can contact our office again if she wants to resume the injection

## 2023-03-31 NOTE — Telephone Encounter (Signed)
Patient called in regarding her appointment in December for her Dysport injection. She advised that you let her know that if her last injection did not help, that there was no reason to continue the injections. In October, she advised Katie RN that she would be here for the December appointment. She is now wanting to know if she needs to keep her appointment on December 19.

## 2023-03-31 NOTE — Telephone Encounter (Signed)
Returned call to pt and she stated that she doesn't think injections are helpful but would like to keep the 04/07/23 injection to try one more time

## 2023-04-01 IMAGING — US US BREAST*R* LIMITED INC AXILLA
1 series · 3 of 3 positions shown · non-contrast
Comparison: Previous exam(s).

CLINICAL DATA: Mass felt in the upper outer periareolar right
breast and mass felt in the 12 to 12:30 o'clock periareolar left
breast on recent physical examination. The patient does not feel a
mass in either breast today other than chronic free silicone in the
superior aspect of the left breast. She had bilateral breast
implants removed 5-6 years ago.



[Series 1: us breast*right* limited inc axilla · 0.05mm/px · 3 of 3 slices shown]
[im 1/3]
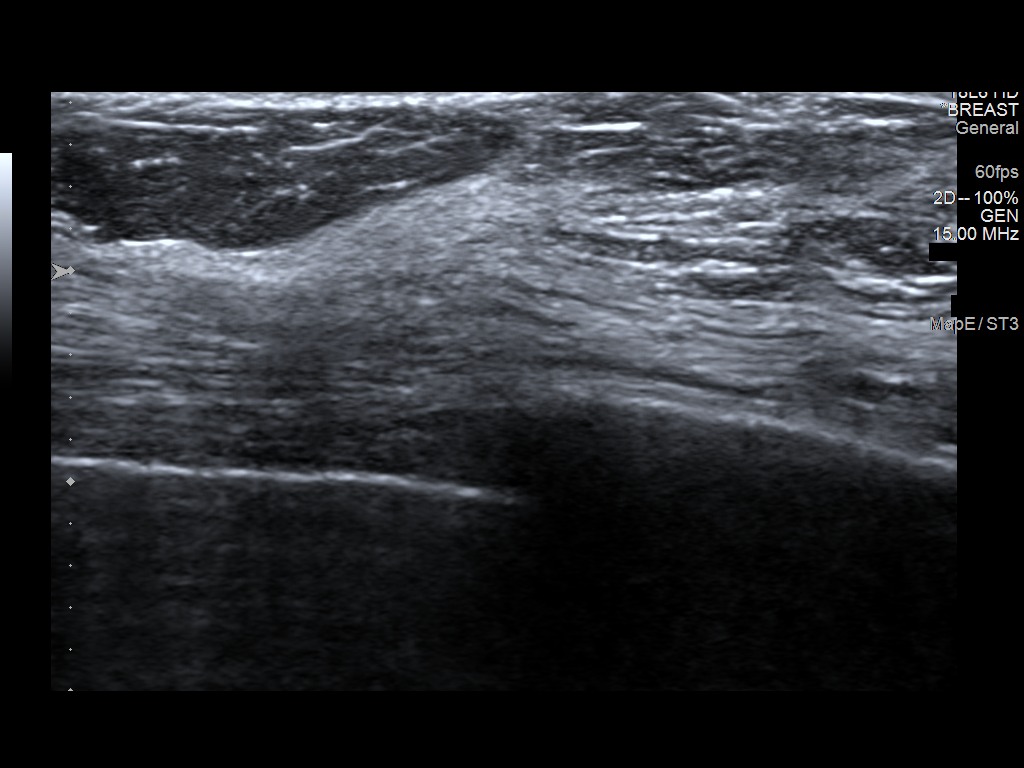
[im 2/3]
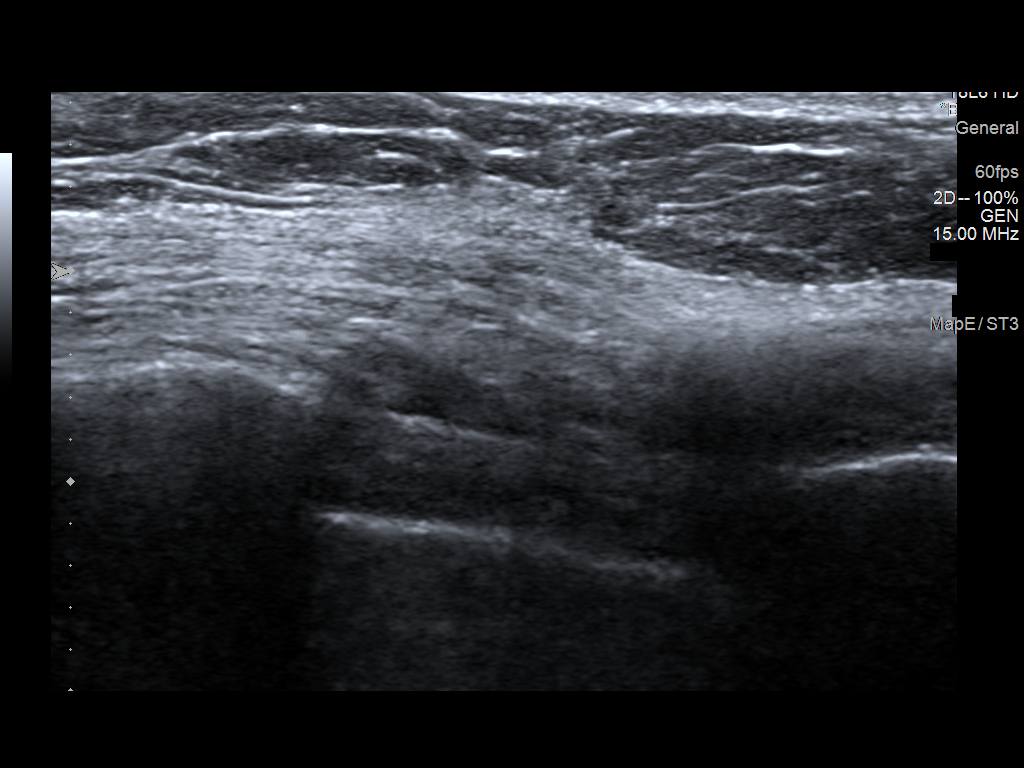
[im 3/3]
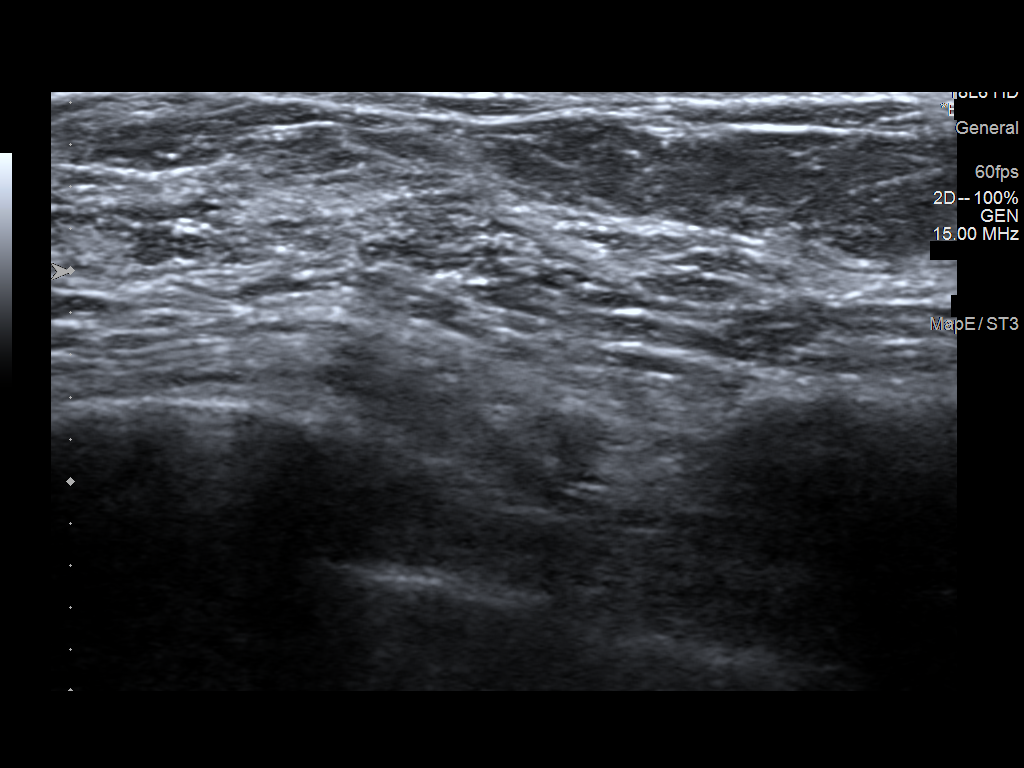

[3 of 3 positions shown; findings below may reference images not displayed]

ACR Breast Density Category b: There are scattered areas of
fibroglandular density.
FINDINGS: Stable free silicone in the superior aspect of the left breast
posteriorly. No mammographically visible masses or other findings
suspicious for malignancy in either breast.

On physical exam, there is an approximately 2.5 cm area of palpable
thickening in the upper outer periareolar right breast and a similar
area in the upper periareolar left breast. There are also multiple
small, rounded, palpable nodules in a group in the upper left breast
superiorly.

Targeted ultrasound is performed, showing normal appearing dense
glandular tissue corresponding to the areas of palpable thickening
in the upper outer periareolar right breast and upper periareolar
left breast. No masses or other findings suspicious for malignancy
were seen at those locations.

An indistinct area of free silicone containing multiple calcified
silicone nodules is demonstrated in the upper left breast
corresponding to the multiple small, rounded, chronic palpable
nodules in the upper left breast which are stable mammographically.
IMPRESSION: 1. No evidence of malignancy in either breast.
2. Stable free silicone with multiple calcified silicone nodules in
the posterior upper left breast.

RECOMMENDATION:
Bilateral screening mammogram in 1 year.

I have discussed the findings and recommendations with the patient.
If applicable, a reminder letter will be sent to the patient
regarding the next appointment.

BI-RADS CATEGORY  2: Benign.

## 2023-04-07 ENCOUNTER — Ambulatory Visit: Payer: Medicare Other | Admitting: Neurology

## 2023-04-07 VITALS — BP 138/84

## 2023-04-07 DIAGNOSIS — G243 Spasmodic torticollis: Secondary | ICD-10-CM

## 2023-04-07 DIAGNOSIS — M542 Cervicalgia: Secondary | ICD-10-CM

## 2023-04-07 MED ORDER — ABOBOTULINUMTOXINA 500 UNITS IM SOLR
1000.0000 [IU] | INTRAMUSCULAR | Status: AC
Start: 2023-04-07 — End: ?
  Administered 2023-04-07: 1000 [IU] via INTRAMUSCULAR

## 2023-04-07 NOTE — Progress Notes (Signed)
Dysport 500units x 2 vial  [id number] Exp-06/16/24 B/B Bacteriostatic 0.9% Sodium Chloride- 5 mL  Lot: [id number] Expiration: 02/18/24 NDC: [id number] Dx: O53.6 ,   M54.2   WITNESSED [id number] Yetta Barre RN

## 2023-04-07 NOTE — Progress Notes (Signed)
Chief Complaint  Patient presents with   Injections    Rm14, alone, Pt is well and ready for injection      ASSESSMENT AND PLAN  Julie Shelton is a 82 y.o. female  Cervical dystonia   She has moderate retrocollis, moderate right tilt, moderate left turn, slight left shoulder elevation, severe limited range of motion looking to the right,  mild sagittal anterior shift,    Only temporary mild improvement with previous injection, using Botox or xeomin up to 400 units, last injection was on Sep 08 2020.   EMG guided Dysport injection 500 unitsx2(500 units of Dysport/was dissolved into 2.5 cc of normal saline)  Left splenius cervix 0.5 cc x 2=1.0 cc Left splenius capitis 0.5 ccx 3=1.5 cc Left longissimus capitis 0.5 cc Left levator scapular 0.5 cc Left inferior oblique capitis 0.5 cc  Right splenius cervix 0.5 cc Levator scapular 0.5 cc  She denies significant improvement with Dysport injection, if she does not show improvement with this injection, will not continue   DIAGNOSTIC DATA (LABS, IMAGING, TESTING) - I reviewed patient records, labs, notes, testing and imaging myself where available.  MRI of brain w/wo December 05 2019. No acute abnormality.  No cause for hearing loss identified.   Atrophy with mild white matter changes consistent with microvascular Ischemia.  MRA of neck was negative.  MRA of head No intracranial stenosis or large vessel occlusion   2 mm outpouching at the origin of the posterior communicating artery bilaterally compatible with infundibulum.  2015: MRI cervical spine (without) demonstrating:  1. At C3-4 uncovertebral joint hypertrophy and facet hypertrophy with mild  right foraminal stenosis  2. At C4-5: uncovertebral joint hypertrophy and facet hypertrophy with no  spinal stenosis or foraminal narrowing  3. Anterior discectomy and fusion at C5, C6 and C7 with anterior vertebral  body screws and metal  4. Compared to prior MRI from  05/02/04, the post-surgical changes are new  findings. Also, the degenerative changes at C3-4 and C4-5 have slightly  progressed.   HISTORICAL  Julie Shelton is a 82 year old female, seen in request by her primary care physician Shelton, Julie Maduro, to continue with care of her cervical dystonia,  I reviewed and summarized the referring note. Hypothyroidism, on supplement   She was diagnosed with cervical dystonia around 2008 at [facility]   Around 2004, wiithout clear trigger, she had gradual onset of head pulling to her left shoulder, difficulty turning her neck to either direction especially to her left, later she also developed shooting pain from her left cervical region down to her left shoulder, numbness involving left fifth finger   She had MRI of cervical in 2006 at [facility] imaging, which showed loss of height with left eccentric disc osteophyte complex at C5-6, and C6 and 7, with mild central canal narrowing, minimum left foraminal narrowing at C6 and 7, repeat MRI of cervical in 2007, a year later, continued to demonstrate spondylosis at C5-6, C6 and 7, with endplate osteophyte, covering bulging disc material, mild canal stenosis, no cord compression, there is no significant foraminal stenosis.   However with her persistent left-sided neck pain, shooting pain to her left shoulder, she underwent anterior cervical discectomy, and fusion at C5-6, C6 and 7 with tricortical autograft, and plating with a short statue synthetic system C5-C7 by Julie Shelton at [facility] in August 2008, this was done at [place], [place],   She only has minimum improvement of her neck pain postsurgically, eventually she  was evaluated by [facility] in October 2008, was correctly diagnosed with spasmodic torticollis, has received repeat EMG guided Botox injection under supervision of Dr. Jeremy Shelton, she was receiving injections every 3-5 months, last injection was April 2,2014, she reported mild to  moderate improvement with each injection, due to insurance change, she could no longer go to [facility] for continued treatment. She desires to transfer her injection locally.   She denied any significant side effect was Botox injection, was not sure about the dosage, but reported continued mild to moderate improvement with each injection, over the past few months, because prolonged interval with the last injection, she noticed worsening neck pain, difficulty turning her neck, and the worsening abnormal posture.   She is also taking clonazepam 0.5 mg twice a day, she has significant neck pain, 8 out of 10, which has put limitation on her daily activity,  She denies gait difficulty, no bilateral arm persistent sensory loss, all weakness,   I started to give her her  EMG guided Xeomin injection in Feb 11th 2015, no significant side effect noticed either.  UPDATE Aug 24 2022: Over the past many years, she was given Xeomin, Botox up to 400 units without helping her symptoms,   Last injection was in May 2022, returns today complaining of worsening neck issues, forceful turning towards the left side, left posterior neck muscle tensed up, difficulty looking towards right side, affecting her gait, she enjoys gardening   MRI of the brain w/wo in August 2021, no acute abnormality MRA of the neck no large vessel disease MRA of brain incidental 2 mm outpouching of the origin at the posterior communicating artery bilaterally with infundibulum  She reported improvement with Xanax1 mg 2 tablets as needed, which was given as premedication for MRI, after Xanax, she can move her neck much better,  She was given clonazepam 0.5 mg as needed, prescription was from her primary care physician, no significant improvement noted  UPDATE October 07 2022: Slow worsening cervical dystonia over the years, she complains of deep achy pain of neck muscles, especially on the left side, tendency for neck pulling backwards, forceful  turning towards the left side, we are using Dysport 500 units x 2 today, risk and benefit, potential side effect explained, consent form signed,  UPDATE Sept 26th 2024; She denies significant improvement with previous Dysport 1000 units for her cervical dystonia, complains of worsening neck pain, difficulty walking, did have a history of cervical decompression surgery in the past, today's examination noted limited range of motion of her neck,  UPDATE Apr 07 2023: MRI of cervical spine October 2024 showed ACDF C5-7, multilevel degenerative changes, no evidence of canal stenosis variable degree of foraminal narrowing most noticeable at C3-4,  She denies significant improvement with Dysport 1000 units, no significant side effect, complains of stiffness, upper cervical, and shoulder blade area tension  PHYSICAL EXAM:   Vitals:   04/07/23 1229  BP: 138/84   There is no height or weight on file to calculate BMI.  PHYSICAL EXAM   Vitals:   01/09/20 1500  BP: 138/80  Pulse: 79  Weight: 180 lb 8 oz (81.9 kg)  Height: 5\' 9"  (1.753 m)   Body mass index is 26.66 kg/m.   PHYSICAL EXAMNIATION:  Gen: NAD, conversant, well nourised, well groomed                     Cardiovascular: Regular rate rhythm, no peripheral edema, warm, nontender. Eyes: Conjunctivae clear without  exudates or hemorrhage Neck: Supple, no carotid bruits. Pulmonary: Clear to auscultation bilaterally   NEUROLOGICAL EXAM: PHYSICAL EXAMNIATION: She has moderate retrocollis, moderate right tilt, moderate left turn, slight left shoulder elevation, severe limited range of motion looking to the right more than to the left, mild sagittal anterior shift, frequent no no head shaking, significant hypertrophy of left posterior cervical muscles   MENTAL STATUS: Speech/Cognition: Awake, alert, normal speech, oriented to history taking and casual conversation.  CRANIAL NERVES: CN II: Visual fields are full to confrontation.   Pupils are round equal and briskly reactive to light. CN III, IV, VI: extraocular movement are normal. No ptosis. CN V: Facial sensation is intact to light touch. CN VII: Face is symmetric with normal eye closure and smile. CN VIII: Hearing is normal to casual conversation CN IX, X: Palate elevates symmetrically. Phonation is normal. CN XI: Head turning and shoulder shrug are intact CN XII: Tongue is midline with normal movements and no atrophy.  MOTOR: Muscle bulk and tone are normal. Muscle strength is normal.  REFLEXES: Reflexes are 2  and symmetric at the biceps, triceps, knees and ankles. Plantar responses are flexor.  SENSORY: Intact to light touch, pinprick, positional and vibratory sensation at fingers and toes.  COORDINATION: There is no trunk or limb ataxia.    GAIT/STANCE: She needs push-up to get up from seated position, neck tilted towards her right shoulder, chin turning towards the left side, mild retrocollis, mild left shoulder elevation  REVIEW OF SYSTEMS:  Full 14 system review of systems performed and notable only for as above All other review of systems were negative.   ALLERGIES: No Known Allergies  HOME MEDICATIONS: Current Outpatient Medications  Medication Sig Dispense Refill   ALPRAZolam (XANAX) 0.5 MG tablet Take 1-2 tablets 30 minutes prior to MRI, may repeat once as needed. Must have driver. 3 tablet 0   cetirizine (ZYRTEC) 10 MG chewable tablet Chew 10 mg by mouth as needed.      DULoxetine (CYMBALTA) 60 MG capsule Take 1 capsule (60 mg total) by mouth daily. 30 capsule 11   ibuprofen (ADVIL,MOTRIN) 200 MG tablet Take 200 mg by mouth every 6 (six) hours as needed.     levothyroxine (SYNTHROID, LEVOTHROID) 75 MCG tablet Take 75 mcg by mouth daily before breakfast.      tiZANidine (ZANAFLEX) 4 MG tablet Take 1 tablet (4 mg total) by mouth every 8 (eight) hours as needed. 60 tablet 6   Current Facility-Administered Medications  Medication Dose  Route Frequency Provider Last Rate Last Admin   AbobotulinumtoxinA (DYSPORT) 500 units injection 1,000 Units  1,000 Units Intramuscular Once Levert Feinstein, MD       AbobotulinumtoxinA (DYSPORT) 500 units injection 1,000 Units  1,000 Units Intramuscular Q90 days Levert Feinstein, MD        PAST MEDICAL HISTORY: Past Medical History:  Diagnosis Date   Allergic rhinitis    Chronic pain    Depression    GERD (gastroesophageal reflux disease)    Hypothyroidism    Torticollis    Vitamin D deficiency     PAST SURGICAL HISTORY: Past Surgical History:  Procedure Laterality Date   AUGMENTATION MAMMAPLASTY     Removed 5 years ago    BREAST ENHANCEMENT SURGERY     breast implants removed     LAPAROSCOPIC TUBAL LIGATION     NECK SURGERY     SHOULDER OPEN ROTATOR CUFF REPAIR     2003    FAMILY HISTORY: Family History  Problem Relation Age of Onset   Thyroid disease Mother    Dementia Father     SOCIAL HISTORY: Social History   Socioeconomic History   Marital status: Married    Spouse name: Dorma Russell   Number of children: 2   Years of education: 12   Highest education level: Not on file  Occupational History    Employer: RETIRED    Comment: retired  Tobacco Use   Smoking status: Former    Types: Cigarettes   Smokeless tobacco: Never   Tobacco comments:    Quit in 1972  Vaping Use   Vaping status: Never Used  Substance and Sexual Activity   Alcohol use: Yes    Comment: social-beer   Drug use: No   Sexual activity: Not on file  Other Topics Concern   Not on file  Social History Narrative   Right handed   Patient lives at home with her husband Dorma Russell) .   Patient is retired.    Education- High school    Caffeine- two-three cups daily.   Social Drivers of Corporate investment banker Strain: Not on file  Food Insecurity: Not on file  Transportation Needs: Not on file  Physical Activity: Not on file  Stress: Not on file  Social Connections: Not on file  Intimate Partner  Violence: Not on file      Levert Feinstein, M.D. Ph.D.  [facility] [address] [place], [place] [address] Ph: [phone] Fax: [phone]  CC:  Thana Ates, MD [address] [place],  [place] [address]  Thana Ates, MD

## 2023-05-30 ENCOUNTER — Encounter: Payer: Self-pay | Admitting: Gastroenterology

## 2023-06-06 ENCOUNTER — Ambulatory Visit: Admission: RE | Admit: 2023-06-06 | Payer: Medicare Other | Source: Home / Self Care | Admitting: Gastroenterology

## 2023-06-06 SURGERY — COLONOSCOPY WITH PROPOFOL
Anesthesia: General

## 2023-06-10 DIAGNOSIS — K5900 Constipation, unspecified: Secondary | ICD-10-CM | POA: Diagnosis not present

## 2023-06-10 DIAGNOSIS — K5730 Diverticulosis of large intestine without perforation or abscess without bleeding: Secondary | ICD-10-CM | POA: Diagnosis not present

## 2023-06-10 DIAGNOSIS — R12 Heartburn: Secondary | ICD-10-CM | POA: Diagnosis not present

## 2023-07-05 ENCOUNTER — Ambulatory Visit: Payer: Medicare Other | Admitting: Neurology

## 2023-08-19 DIAGNOSIS — K219 Gastro-esophageal reflux disease without esophagitis: Secondary | ICD-10-CM | POA: Diagnosis not present

## 2023-08-19 DIAGNOSIS — R600 Localized edema: Secondary | ICD-10-CM | POA: Diagnosis not present

## 2023-08-19 DIAGNOSIS — G243 Spasmodic torticollis: Secondary | ICD-10-CM | POA: Diagnosis not present

## 2023-08-19 DIAGNOSIS — Z0000 Encounter for general adult medical examination without abnormal findings: Secondary | ICD-10-CM | POA: Diagnosis not present

## 2023-08-19 DIAGNOSIS — Z79899 Other long term (current) drug therapy: Secondary | ICD-10-CM | POA: Diagnosis not present

## 2023-08-19 DIAGNOSIS — Z23 Encounter for immunization: Secondary | ICD-10-CM | POA: Diagnosis not present

## 2023-08-19 DIAGNOSIS — J309 Allergic rhinitis, unspecified: Secondary | ICD-10-CM | POA: Diagnosis not present

## 2023-08-19 DIAGNOSIS — M8589 Other specified disorders of bone density and structure, multiple sites: Secondary | ICD-10-CM | POA: Diagnosis not present

## 2023-08-19 DIAGNOSIS — E559 Vitamin D deficiency, unspecified: Secondary | ICD-10-CM | POA: Diagnosis not present

## 2023-08-19 DIAGNOSIS — E039 Hypothyroidism, unspecified: Secondary | ICD-10-CM | POA: Diagnosis not present

## 2023-08-19 DIAGNOSIS — E7800 Pure hypercholesterolemia, unspecified: Secondary | ICD-10-CM | POA: Diagnosis not present

## 2023-11-10 DIAGNOSIS — G243 Spasmodic torticollis: Secondary | ICD-10-CM | POA: Diagnosis not present

## 2023-11-10 DIAGNOSIS — R531 Weakness: Secondary | ICD-10-CM | POA: Diagnosis not present

## 2023-11-10 DIAGNOSIS — G629 Polyneuropathy, unspecified: Secondary | ICD-10-CM | POA: Diagnosis not present

## 2024-04-25 ENCOUNTER — Ambulatory Visit: Attending: Neurology

## 2024-04-25 DIAGNOSIS — R262 Difficulty in walking, not elsewhere classified: Secondary | ICD-10-CM | POA: Diagnosis present

## 2024-04-25 DIAGNOSIS — R2681 Unsteadiness on feet: Secondary | ICD-10-CM | POA: Diagnosis present

## 2024-04-25 DIAGNOSIS — R269 Unspecified abnormalities of gait and mobility: Secondary | ICD-10-CM | POA: Diagnosis present

## 2024-04-25 DIAGNOSIS — R278 Other lack of coordination: Secondary | ICD-10-CM | POA: Diagnosis present

## 2024-04-25 DIAGNOSIS — M542 Cervicalgia: Secondary | ICD-10-CM | POA: Insufficient documentation

## 2024-04-25 DIAGNOSIS — M6281 Muscle weakness (generalized): Secondary | ICD-10-CM | POA: Diagnosis present

## 2024-04-25 NOTE — Therapy (Signed)
 " OUTPATIENT PHYSICAL THERAPY NEURO and CERVICAL  EVALUATION   Patient Name: Julie Shelton MRN: [id number] DOB:1940/08/29, 84 y.o., female Today's Date: 04/26/2024   PCP: Dr. Trula Shelton REFERRING PROVIDER: Dr. Jannett Shelton   END OF SESSION:  PT End of Session - 04/25/24 1454     Visit Number 1    Number of Visits 24    Date for Recertification  07/18/24    Progress Note Due on Visit 10    PT Start Time 1445    PT Stop Time 1528    PT Time Calculation (min) 43 min    Equipment Utilized During Treatment Gait belt    Activity Tolerance Patient tolerated treatment well    Behavior During Therapy WFL for tasks assessed/performed          Past Medical History:  Diagnosis Date   Allergic rhinitis    Chronic pain    Depression    GERD (gastroesophageal reflux disease)    Hypothyroidism    Torticollis    Vitamin D deficiency    Past Surgical History:  Procedure Laterality Date   AUGMENTATION MAMMAPLASTY     Removed 5 years ago    BREAST ENHANCEMENT SURGERY     breast implants removed     LAPAROSCOPIC TUBAL LIGATION     NECK SURGERY     SHOULDER OPEN ROTATOR CUFF REPAIR     2003   Patient Active Problem List   Diagnosis Date Noted   Gait abnormality 01/13/2023   Left leg paresthesias 09/08/2020   Sensorineural hearing loss (SNHL) of left ear 11/12/2019   Dizziness after extension of neck 11/12/2019   Exertional dyspnea 06/17/2018   Palpitations 06/17/2018   Other fatigue 06/17/2018   Hearing loss 05/24/2018   Cervical dystonia 04/24/2014   Neck pain 05/31/2013   Torticollis    Depression    GERD (gastroesophageal reflux disease)    Hypothyroidism    Allergic rhinitis    Chronic pain    Vitamin D deficiency     ONSET DATE: cervical issues for past 20 years, balance for a couple of years  REFERRING DIAG:  R26.89 (ICD-10-CM) - Imbalance  G24.3 (ICD-10-CM) - Cervical dystonia    THERAPY DIAG:  Unsteadiness on feet - Plan: PT plan of care  cert/re-cert  Muscle weakness (generalized) - Plan: PT plan of care cert/re-cert  Difficulty in walking, not elsewhere classified - Plan: PT plan of care cert/re-cert  Abnormality of gait and mobility - Plan: PT plan of care cert/re-cert  Other lack of coordination - Plan: PT plan of care cert/re-cert  Cervicalgia - Plan: PT plan of care cert/re-cert  Rationale for Evaluation and Treatment: Rehabilitation  SUBJECTIVE:  SUBJECTIVE STATEMENT: Patient reports long standing history of cervical pain and tightness with difficulty turning head which she states has affected her balance. She reports unsteady overall and frequent episodes of LOB. Reports has been seen by PT in past - like 2 years ago with no significant improvement in neck pain. Reports enjoys getting outdoors and would like to be able to do more yard work but her balance makes her fearful and neck pain restricts many activities and hobbies.   Pt accompanied by: self  PERTINENT HISTORY:  Per medical office visit from Julie Stallion, NP: Chronic cervical dystonia with symptoms since 2004, formally diagnosed in 2008. Symptoms include head pulling to the left, difficulty turning the neck, shooting pain from the left cervical region to the left shoulder, and numbness in the left fifth finger. Previous treatments with Botox , Xeomin, and Dysport  injections, as well as clonazepam  and baclofen, have been ineffective. The condition is significantly bothersome, causing pain and stiffness, impacting daily activities. - Discontinue carbidopa levodopa XR by reducing to one pill per day for a week, then stop. - Order Botox  for cervical dystonia (with Dr. Jannett Shelton)  - Referred to [facility] for further evaluation and management. - Referred  to [facility] for physical therapy to assist with balance and mobility issues.  2. Peripheral neuropathy Peripheral neuropathy with symptoms of tingling, numbness, and burning, present for approximately one year. No significant changes in sharp/dull sensation or temperature.    PAIN:  Are you having pain? Yes: NPRS scale:   Pain location: Left side neck/upper back  Pain description: Nauseating, stiff (hallow feeling left side of face)  Aggravating factors: moving my head, talking Relieving factors: rest, tylenol (doesn't help much),   PRECAUTIONS: Fall  RED FLAGS: None   WEIGHT BEARING RESTRICTIONS: No  FALLS: Has patient fallen in last 6 months? No- but states several near falls and constant imbalance.   LIVING ENVIRONMENT: Lives with: lives with their spouse Lives in: House/apartment Stairs: Yes: External: 2 steps; on left going up Has following equipment at home: Single point cane, Walker - 2 wheeled, Environmental Consultant - 4 wheeled, and shower chair  PLOF: Independent  PATIENT GOALS: Get outside and work in yard without losing my balance and falling. Move my head better for improved driving  OBJECTIVE:  Note: Objective measures were completed at Evaluation unless otherwise noted.  DIAGNOSTIC FINDINGS: STUDY DATE: 02/10/2023 PATIENT NAME: Julie Shelton DOB: 03-27-1941 MRN: [id number]   EXAM: MRI of the cervical spine   ORDERING CLINICIAN: Modena Callander MD, PhD CLINICAL HISTORY: 84 year old woman with cervicalgia COMPARISON FILMS: 06/13/2013   TECHNIQUE: MRI of the cervical spine was obtained utilizing 3 mm sagittal slices from the posterior fossa down to the T3-4 level with T1, T2 and inversion recovery views. In addition 4 mm axial slices from C2-3 down to T1-2 level were included with T2 and gradient echo views. CONTRAST: None IMAGING SITE: [facility] [place], [place]    FINDINGS: :  On sagittal images, the spine is imaged  from above the cervicomedullary junction to T2.  The visible brain and the cervicomedullary junction appears normal.  Paravertebral soft tissue appears normal.   The spinal cord has a normal caliber and signal.  There is ACDF at C5-C7, unchanged compared to the 2015 MRI.  The vertebral bodies are normally aligned.   The vertebral bodies have normal signal.     The discs and interspaces were further evaluated on axial views from  C2 to T1 as follows:   C2-C3: There is mild left facet hypertrophy.  The level is otherwise normal and there is no spinal stenosis or nerve root compression.   C3-C4: There is mild right facet hypertrophy with arthrodesis.  Mild right uncovertebral spurring is also noted.  These combined to cause moderate right foraminal narrowing though there is no spinal stenosis or nerve root compression.   C4-C5: There is facet hypertrophy and minimal uncovertebral spurring causing minimal foraminal narrowing but no spinal stenosis or nerve root compression.   C5-C6: There has been ACDF.  There is no spinal stenosis or nerve root compression.   C6-C7: There has been ACDF.  There is some uncovertebral spurring to the right causing mild right foraminal narrowing.  No spinal stenosis or nerve root compression.   C7-T1: This level appears normal.   T1-T2: This level appears normal.   Compared to the MRI from 06/13/2013, the degenerative changes at C3-C4 have progressed.  Other levels are unchanged.   IMPRESSION: This MRI of the cervical spine without contrast shows the following: The spinal cord appears normal. At C3-C4, there is right facet hypertrophy with arthrodesis, that has occurred since the 2015 MRI.SABRA  There is moderate right foraminal narrowing but no spinal stenosis or nerve root compression.  The degenerative changes have progressed compared to the 2015 MRI. At C4-C5, there are mild stable degenerative changes but no spinal stenosis or nerve root compression. ACDF from C5-C7,  unchanged compared to the 2015 MRI     INTERPRETING PHYSICIAN:  Richard A. Vear, MD, PhD, FAAN Certified in  Neuroimaging by [employer]  COGNITION: Overall cognitive status: Within functional limits for tasks assessed   SENSATION: Reports some numbness, tingling, and burning sensation in B Feet  COORDINATION: Impaired with walking  EDEMA:  None observed   POSTURE: rounded shoulders, forward head, and Contracture of left neck musculature with head tilting more to left side with restriction with R cervical rotation  LOWER EXTREMITY ROM:     Active  Right Eval Left Eval  Hip flexion    Hip extension    Hip abduction    Hip adduction    Hip internal rotation    Hip external rotation    Knee flexion    Knee extension    Ankle dorsiflexion    Ankle plantarflexion    Ankle inversion    Ankle eversion     (Blank rows = not tested)  LOWER EXTREMITY MMT:    MMT Right Eval Left Eval  Hip flexion 4 4  Hip extension 4 4  Hip abduction 4 4  Hip adduction 4+ 4+  Hip internal rotation 4 4  Hip external rotation 4 4  Knee flexion 4 4  Knee extension 4 4  Ankle dorsiflexion 4 4  Ankle plantarflexion    Ankle inversion    Ankle eversion    (Blank rows = not tested)  BED MOBILITY:  Not tested  TRANSFERS: Sit to stand: Complete Independence  Assistive device utilized: None     Stand to sit: Complete Independence  Assistive device utilized: None     Chair to chair: Complete Independence  Assistive device utilized: None       RAMP:  Not tested  CURB:  Not tested  STAIRS: Not tested GAIT: Findings: Gait Characteristics: decreased arm swing- Right, decreased arm swing- Left, decreased step length- Right, decreased step length- Left, and decreased stride length, Distance walked: approx 120 feet, Assistive device utilized:None,  Level of assistance: CGA, and Comments:    FUNCTIONAL TESTS:  5 times sit to stand: To be assessed next visit 6  minute walk test: to be assessed next visit TUG: To be assessed next visit 10 meter walk test: To be assessed next visit Berg Balance Scale:   [facility] PT Assessment - 04/26/24 2035       Standardized Balance Assessment   Standardized Balance Assessment Berg Balance Test      Berg Balance Test   Sit to Stand Able to stand without using hands and stabilize independently    Standing Unsupported Able to stand safely 2 minutes    Sitting with Back Unsupported but Feet Supported on Floor or Stool Able to sit safely and securely 2 minutes    Stand to Sit Sits safely with minimal use of hands    Transfers Able to transfer safely, minor use of hands    Standing Unsupported with Eyes Closed Able to stand 10 seconds safely    Standing Unsupported with Feet Together Able to place feet together independently and stand 1 minute safely    From Standing, Reach Forward with Outstretched Arm Can reach forward >12 cm safely (5)    From Standing Position, Pick up Object from Floor Able to pick up shoe safely and easily    From Standing Position, Turn to Look Behind Over each Shoulder Turn sideways only but maintains balance    Turn 360 Degrees Able to turn 360 degrees safely one side only in 4 seconds or less    Standing Unsupported, Alternately Place Feet on Step/Stool Able to stand independently and safely and complete 8 steps in 20 seconds    Standing Unsupported, One Foot in Front Able to plae foot ahead of the other independently and hold 30 seconds    Standing on One Leg Able to lift leg independently and hold 5-10 seconds    Total Score 50          PATIENT SURVEYS:  ABC- To be issued next visit NDI- To be issued next visit                                                                                                                                TREATMENT DATE: 04/25/2024  PT EVALUATION    PATIENT EDUCATION: Education details: Purpose of PT; Plan of care Person educated:  Patient Education method: Explanation Education comprehension: verbalized understanding  HOME EXERCISE PROGRAM: To be issued next 1-2 visits  GOALS: Goals reviewed with patient? Yes  SHORT TERM GOALS: Target date: 06/06/2024  Pt will be independent with HEP in order to improve strength and balance in order to decrease fall risk and improve function at home and work. Baseline: EVAL- No formal HEP in Place Goal status: INITIAL   LONG TERM GOALS: Target date: 07/18/2024  ASSESSMENT:  CLINICAL IMPRESSION: Patient is a 84  y.o. female who was seen today for physical therapy evaluation  and treatment for impaired balance and cervical dystonia. PT examination reveals deficits . Pt presents with deficits in strength, gait and balance today. Will continue with further assessment of cervical diagnosis and further assessment of Balance (FGA) next visit. Patient will benefit from skilled PT services to address these deficits, improve balance, and decrease risk for future falls.    OBJECTIVE IMPAIRMENTS: Abnormal gait, decreased activity tolerance, decreased balance, decreased coordination, decreased endurance, difficulty walking, decreased ROM, decreased strength, hypomobility, increased fascial restrictions, increased muscle spasms, impaired flexibility, impaired sensation, postural dysfunction, and pain.   ACTIVITY LIMITATIONS: carrying, lifting, bending, sitting, standing, squatting, sleeping, stairs, and transfers  PARTICIPATION LIMITATIONS: meal prep, cleaning, laundry, driving, shopping, community activity, and yard work  PERSONAL FACTORS: Age, Fitness, Past/current experiences, and 1 comorbidity: depression are also affecting patient's functional outcome.   REHAB POTENTIAL: Good  CLINICAL DECISION MAKING: Stable/uncomplicated  EVALUATION COMPLEXITY: Low  PLAN:  PT FREQUENCY: 1-2x/week  PT DURATION: 12 weeks  PLANNED INTERVENTIONS: 97164- PT Re-evaluation, 97750- Physical  Performance Testing, 97110-Therapeutic exercises, 97530- Therapeutic activity, [id number]- Neuromuscular re-education, 97535- Self Care, [id number]- Manual therapy, [id number]- Gait training, [id number]- Orthotic Initial, [phone]- Orthotic/Prosthetic subsequent, [phone]- Canalith repositioning, [id number]- Electrical stimulation (manual), [phone] (1-2 muscles), [id number] (3+ muscles)- Dry Needling, Patient/Family education, Balance training, Stair training, Taping, Joint mobilization, Joint manipulation, Spinal mobilization, Vestibular training, DME instructions, Cryotherapy, and Moist heat  PLAN FOR NEXT SESSION:  Assess Cervical diagnosis next visit and add to goals as appropriate FGA assessment- revise goal if needed.  6 min walk test- revise goal if needed. Add HEP for Balance and cervical needs next 1-2 visit TUG- next 1-2 visits 10 MWT next 1-2 visit NDI - next visit ABC - Next visit   Reyes LOISE London, PT 04/26/2024, 9:04 PM        "

## 2024-04-30 ENCOUNTER — Ambulatory Visit

## 2024-05-02 ENCOUNTER — Ambulatory Visit

## 2024-05-02 DIAGNOSIS — M542 Cervicalgia: Secondary | ICD-10-CM

## 2024-05-02 DIAGNOSIS — M6281 Muscle weakness (generalized): Secondary | ICD-10-CM

## 2024-05-02 DIAGNOSIS — R269 Unspecified abnormalities of gait and mobility: Secondary | ICD-10-CM

## 2024-05-02 DIAGNOSIS — R262 Difficulty in walking, not elsewhere classified: Secondary | ICD-10-CM

## 2024-05-02 DIAGNOSIS — R2681 Unsteadiness on feet: Secondary | ICD-10-CM

## 2024-05-02 DIAGNOSIS — R278 Other lack of coordination: Secondary | ICD-10-CM

## 2024-05-02 NOTE — Therapy (Signed)
 " OUTPATIENT PHYSICAL THERAPY NEURO and CERVICAL  TREATMENT   Patient Name: Julie Shelton MRN: [id number] DOB:06/20/1940, 84 y.o., female Today's Date: 05/03/2024   PCP: Dr. Trula Brim REFERRING PROVIDER: Dr. Jannett Fairly   END OF SESSION:  PT End of Session - 05/02/24 1314     Visit Number 2    Number of Visits 24    Date for Recertification  07/18/24    Progress Note Due on Visit 10    PT Start Time 1315    PT Stop Time 1359    PT Time Calculation (min) 44 min    Equipment Utilized During Treatment Gait belt    Activity Tolerance Patient tolerated treatment well    Behavior During Therapy WFL for tasks assessed/performed          Past Medical History:  Diagnosis Date   Allergic rhinitis    Chronic pain    Depression    GERD (gastroesophageal reflux disease)    Hypothyroidism    Torticollis    Vitamin D deficiency    Past Surgical History:  Procedure Laterality Date   AUGMENTATION MAMMAPLASTY     Removed 5 years ago    BREAST ENHANCEMENT SURGERY     breast implants removed     LAPAROSCOPIC TUBAL LIGATION     NECK SURGERY     SHOULDER OPEN ROTATOR CUFF REPAIR     2003   Patient Active Problem List   Diagnosis Date Noted   Gait abnormality 01/13/2023   Left leg paresthesias 09/08/2020   Sensorineural hearing loss (SNHL) of left ear 11/12/2019   Dizziness after extension of neck 11/12/2019   Exertional dyspnea 06/17/2018   Palpitations 06/17/2018   Other fatigue 06/17/2018   Hearing loss 05/24/2018   Cervical dystonia 04/24/2014   Neck pain 05/31/2013   Torticollis    Depression    GERD (gastroesophageal reflux disease)    Hypothyroidism    Allergic rhinitis    Chronic pain    Vitamin D deficiency     ONSET DATE: cervical issues for past 20 years, balance for a couple of years  REFERRING DIAG:  R26.89 (ICD-10-CM) - Imbalance  G24.3 (ICD-10-CM) - Cervical dystonia    THERAPY DIAG:  Unsteadiness on feet  Muscle weakness  (generalized)  Difficulty in walking, not elsewhere classified  Abnormality of gait and mobility  Other lack of coordination  Cervicalgia  Rationale for Evaluation and Treatment: Rehabilitation  SUBJECTIVE:  SUBJECTIVE STATEMENT:  FROM today: Patient reports neck feeling very tight - left side- affects her entire left side from her face to her balance.    From EVAL: Patient reports long standing history of cervical pain and tightness with difficulty turning head which she states has affected her balance. She reports unsteady overall and frequent episodes of LOB. Reports has been seen by PT in past - like 2 years ago with no significant improvement in neck pain. Reports enjoys getting outdoors and would like to be able to do more yard work but her balance makes her fearful and neck pain restricts many activities and hobbies.   Pt accompanied by: self  PERTINENT HISTORY:  Per medical office visit from Allyson Stallion, NP: Chronic cervical dystonia with symptoms since 2004, formally diagnosed in 2008. Symptoms include head pulling to the left, difficulty turning the neck, shooting pain from the left cervical region to the left shoulder, and numbness in the left fifth finger. Previous treatments with Botox , Xeomin, and Dysport  injections, as well as clonazepam  and baclofen, have been ineffective. The condition is significantly bothersome, causing pain and stiffness, impacting daily activities. - Discontinue carbidopa levodopa XR by reducing to one pill per day for a week, then stop. - Order Botox  for cervical dystonia (with Dr. Jannett Fairly)  - Referred to [facility] for further evaluation and management. - Referred to [facility] for physical  therapy to assist with balance and mobility issues.  2. Peripheral neuropathy Peripheral neuropathy with symptoms of tingling, numbness, and burning, present for approximately one year. No significant changes in sharp/dull sensation or temperature.    PAIN:  Are you having pain? Yes: NPRS scale:   Pain location: Left side neck/upper back  Pain description: Nauseating, stiff (hallow feeling left side of face)  Aggravating factors: moving my head, talking Relieving factors: rest, tylenol (doesn't help much),   PRECAUTIONS: Fall  RED FLAGS: None   WEIGHT BEARING RESTRICTIONS: No  FALLS: Has patient fallen in last 6 months? No- but states several near falls and constant imbalance.   LIVING ENVIRONMENT: Lives with: lives with their spouse Lives in: House/apartment Stairs: Yes: External: 2 steps; on left going up Has following equipment at home: Single point cane, Walker - 2 wheeled, Environmental Consultant - 4 wheeled, and shower chair  PLOF: Independent  PATIENT GOALS: Get outside and work in yard without losing my balance and falling. Move my head better for improved driving  OBJECTIVE:  Note: Objective measures were completed at Evaluation unless otherwise noted.  DIAGNOSTIC FINDINGS: STUDY DATE: 02/10/2023 PATIENT NAME: Julie Shelton DOB: Feb 10, 1941 MRN: [id number]   EXAM: MRI of the cervical spine   ORDERING CLINICIAN: Modena Callander MD, PhD CLINICAL HISTORY: 84 year old woman with cervicalgia COMPARISON FILMS: 06/13/2013   TECHNIQUE: MRI of the cervical spine was obtained utilizing 3 mm sagittal slices from the posterior fossa down to the T3-4 level with T1, T2 and inversion recovery views. In addition 4 mm axial slices from C2-3 down to T1-2 level were included with T2 and gradient echo views. CONTRAST: None IMAGING SITE: [facility],  [place]    FINDINGS: :  On sagittal images, the spine is imaged from above the cervicomedullary junction to T2.  The visible brain and  the cervicomedullary junction appears normal.  Paravertebral soft tissue appears normal.   The spinal cord has a normal caliber and signal.  There is ACDF at C5-C7, unchanged compared to the 2015 MRI.  The  vertebral bodies are normally aligned.   The vertebral bodies have normal signal.     The discs and interspaces were further evaluated on axial views from C2 to T1 as follows:   C2-C3: There is mild left facet hypertrophy.  The level is otherwise normal and there is no spinal stenosis or nerve root compression.   C3-C4: There is mild right facet hypertrophy with arthrodesis.  Mild right uncovertebral spurring is also noted.  These combined to cause moderate right foraminal narrowing though there is no spinal stenosis or nerve root compression.   C4-C5: There is facet hypertrophy and minimal uncovertebral spurring causing minimal foraminal narrowing but no spinal stenosis or nerve root compression.   C5-C6: There has been ACDF.  There is no spinal stenosis or nerve root compression.   C6-C7: There has been ACDF.  There is some uncovertebral spurring to the right causing mild right foraminal narrowing.  No spinal stenosis or nerve root compression.   C7-T1: This level appears normal.   T1-T2: This level appears normal.   Compared to the MRI from 06/13/2013, the degenerative changes at C3-C4 have progressed.  Other levels are unchanged.   IMPRESSION: This MRI of the cervical spine without contrast shows the following: The spinal cord appears normal. At C3-C4, there is right facet hypertrophy with arthrodesis, that has occurred since the 2015 MRI.SABRA  There is moderate right foraminal narrowing but no spinal stenosis or nerve root compression.  The degenerative changes have progressed compared to the 2015 MRI. At C4-C5, there are mild stable degenerative changes but no spinal stenosis or nerve root compression. ACDF from C5-C7, unchanged compared to the 2015 MRI     INTERPRETING PHYSICIAN:   Richard A. Vear, MD, PhD, FAAN Certified in  Neuroimaging by [employer]  COGNITION: Overall cognitive status: Within functional limits for tasks assessed   SENSATION: Reports some numbness, tingling, and burning sensation in B Feet  COORDINATION: Impaired with walking  EDEMA:  None observed   POSTURE: rounded shoulders, forward head, and Contracture of left neck musculature with head tilting more to left side with restriction with R cervical rotation  LOWER EXTREMITY ROM:     Active  Right Eval Left Eval  Hip flexion    Hip extension    Hip abduction    Hip adduction    Hip internal rotation    Hip external rotation    Knee flexion    Knee extension    Ankle dorsiflexion    Ankle plantarflexion    Ankle inversion    Ankle eversion     (Blank rows = not tested)  LOWER EXTREMITY MMT:    MMT Right Eval Left Eval  Hip flexion 4 4  Hip extension 4 4  Hip abduction 4 4  Hip adduction 4+ 4+  Hip internal rotation 4 4  Hip external rotation 4 4  Knee flexion 4 4  Knee extension 4 4  Ankle dorsiflexion 4 4  Ankle plantarflexion    Ankle inversion    Ankle eversion    (Blank rows = not tested)  BED MOBILITY:  Not tested  TRANSFERS: Sit to stand: Complete Independence  Assistive device utilized: None     Stand to sit: Complete Independence  Assistive device utilized: None     Chair to chair: Complete Independence  Assistive device utilized: None       RAMP:  Not tested  CURB:  Not tested  STAIRS: Not tested GAIT: Findings: Gait Characteristics:  decreased arm swing- Right, decreased arm swing- Left, decreased step length- Right, decreased step length- Left, and decreased stride length, Distance walked: approx 120 feet, Assistive device utilized:None, Level of assistance: CGA, and Comments:    FUNCTIONAL TESTS:  5 times sit to stand: To be assessed next visit 6 minute walk test: to be assessed next visit TUG: To be assessed  next visit 10 meter walk test: To be assessed next visit Berg Balance Scale:     PATIENT SURVEYS:  ABC- To be issued next visit NDI- To be issued next visit                                                                                                                                TREATMENT DATE: 05/02/2024  PT EVALUATION of C-SPINE AROM R/L 10* Cervical Flexion 15* Cervical Extension 12*/8* Cervical Lateral Flexion 18*/20* Cervical Rotation *Indicates pain, overpressure performed unless otherwise indicated  NECK DISABILITY INDEX  Date: 05/02/2024 Score  Pain intensity 4 = The pain is very severe at the moment  2. Personal care (washing, dressing, etc.) 1 =  I can look after myself normally but it causes extra pain  3. Lifting 1 =  I can lift heavy weights but it gives extra pain  4. Reading 4 =  I can hardly read at all because of severe pain in my neck  5. Headaches 3 = I have moderate headaches, which come frequently  6. Concentration 3 = I have a lot of difficulty in concentrating when I want to  7. Work 2 = I can do most of my usual work, but no more  8. Driving 3 = I can't drive my car as long as I want because of moderate pain in my neck  9. Sleeping 1 = My sleep is slightly disturbed (less than 1 hr sleepless)  10. Recreation 3 = I am able to engage in a few of my usual recreation activities because of pain in   my neck  Total 25/50 or 50%   Minimum Detectable Change (90% confidence): 5 points or 10% points   The Activities-Specific Balance Confidence (ABC) Scale 0% 10 20 30  40 50 60 70 80 90 100% No confidence<->completely confident  How confident are you that you will not lose your balance or become unsteady when you . . .   Date tested 05/02/24  Walk around the house 80%  2. Walk up or down stairs 80%  3. Bend over and pick up a slipper from in front of a closet floor 100%  4. Reach for a small can off a shelf at eye level 100%  5. Stand on tip toes and  reach for something above your head 80%  6. Stand on a chair and reach for something 10%  7. Sweep the floor 90%  8. Walk outside the house to a car parked in the driveway 90%  9. Get into or out  of a car 90%  10. Walk across a parking lot to the mall 70%  11. Walk up or down a ramp 90%  12. Walk in a crowded mall where people rapidly walk past you 90%  13. Are bumped into by people as you walk through the mall 70%  14. Step onto or off of an escalator while you are holding onto the railing 70%  15. Step onto or off an escalator while holding onto parcels such that you cannot hold onto the railing 10%  16. Walk outside on icy sidewalks 10%  Total: #/16 70.6%     6 Min Walk Test:  Instructed patient to ambulate as quickly and as safely as possible for 6 minutes using LRAD. Patient was allowed to take standing rest breaks without stopping the test, but if the patient required a sitting rest break the clock would be stopped and the test would be over.  Results: 1085 feet (331 meters, Avg speed 0.92 m/s) using no AD with SBA. Results indicate that the patient has reduced endurance with ambulation compared to age matched norms.  Age Matched Norms: 42-69 yo M: 85 F: 90, 54-79 yo M: 88 F: 471, 19-89 yo M: 417 F: 392 MDC: 58.21 meters (190.98 feet) or 50 meters (ANPTA Core Set of Outcome Measures for Adults with Neurologic Conditions, 2018) m    PATIENT EDUCATION: Education details: Purpose of PT; Plan of care Person educated: Patient Education method: Explanation Education comprehension: verbalized understanding  HOME EXERCISE PROGRAM: To be issued next 1-2 visits  GOALS: Goals reviewed with patient? Yes  SHORT TERM GOALS: Target date: 06/06/2024  Pt will be independent with HEP in order to improve strength and balance in order to decrease fall risk and improve function at home and work. Baseline: EVAL- No formal HEP in Place Goal status: INITIAL   LONG TERM GOALS: Target date:  07/18/2024  1. Pt will demonstrate decrease in NDI by at least 19% in order to demonstrate clinically significant reduction in disability related to neck injury/pain   Baseline: 50%  Goal status: Initial  2. The patient will increase cervical active range of motion by at least 15 in rotation bilaterally and 10 in extension, enabling her to turn her head to check blind spots while driving and perform grooming tasks without pain.  Baseline: See AROM cervical chart  Goal status: INITIAL  3. Pt will improve BERG by at least 3 points in order to demonstrate clinically significant improvement in balance.    Baseline: 50/56  Goal status: INITIAL  4. Pt will increase by at least 52m (176ft) in order to demonstrate clinically significant improvement in cardiopulmonary endurance and community ambulation     Baseline: 1085 feet w/o AD    Goal status: INITIAL   5.  Pt will improve ABC by at least 13% in order to demonstrate clinically significant improvement in balance confidence.   Baseline: 70.6 %  Goal status: INITIAL  ASSESSMENT:  CLINICAL IMPRESSION: Patient is a 84  y.o. female who was seen today for physical therapy  further evaluation  of cervical and balance impairments. She presents with pain limited cervical ROM and increased overall tightness in posterior/med/anterior neck muscles contributing to pulling her head to left side. She also presents with decreased functional endurance with walking compared to age/sex based norms. She also presents with self perceived neck disability aroudn 50% and decreased confidence in her balance (scoring 70.6%).  Pt presents with deficits in strength, gait and balance  today.  Patient will benefit from skilled PT services to address these deficits, improve balance, and decrease risk for future falls.    OBJECTIVE IMPAIRMENTS: Abnormal gait, decreased activity tolerance, decreased balance, decreased coordination, decreased endurance, difficulty walking,  decreased ROM, decreased strength, hypomobility, increased fascial restrictions, increased muscle spasms, impaired flexibility, impaired sensation, postural dysfunction, and pain.   ACTIVITY LIMITATIONS: carrying, lifting, bending, sitting, standing, squatting, sleeping, stairs, and transfers  PARTICIPATION LIMITATIONS: meal prep, cleaning, laundry, driving, shopping, community activity, and yard work  PERSONAL FACTORS: Age, Fitness, Past/current experiences, and 1 comorbidity: depression are also affecting patient's functional outcome.   REHAB POTENTIAL: Good  CLINICAL DECISION MAKING: Stable/uncomplicated  EVALUATION COMPLEXITY: Low  PLAN:  PT FREQUENCY: 1-2x/week  PT DURATION: 12 weeks  PLANNED INTERVENTIONS: 97164- PT Re-evaluation, 97750- Physical Performance Testing, 97110-Therapeutic exercises, 97530- Therapeutic activity, [id number]- Neuromuscular re-education, 97535- Self Care, [id number]- Manual therapy, [id number]- Gait training, [id number]- Orthotic Initial, [id number]- Orthotic/Prosthetic subsequent, [phone]- Canalith repositioning, [id number]- Electrical stimulation (manual), [phone] (1-2 muscles), [id number] (3+ muscles)- Dry Needling, Patient/Family education, Balance training, Stair training, Taping, Joint mobilization, Joint manipulation, Spinal mobilization, Vestibular training, DME instructions, Cryotherapy, and Moist heat  PLAN FOR NEXT SESSION:   FGA assessment- revise goal if needed.  Add HEP for Balance and cervical needs next 1-2 visit    Reyes LOISE London, PT 05/03/2024, 10:08 AM        "

## 2024-05-03 ENCOUNTER — Ambulatory Visit

## 2024-05-08 ENCOUNTER — Ambulatory Visit

## 2024-05-08 DIAGNOSIS — R269 Unspecified abnormalities of gait and mobility: Secondary | ICD-10-CM

## 2024-05-08 DIAGNOSIS — R2681 Unsteadiness on feet: Secondary | ICD-10-CM | POA: Diagnosis not present

## 2024-05-08 DIAGNOSIS — M6281 Muscle weakness (generalized): Secondary | ICD-10-CM

## 2024-05-08 DIAGNOSIS — M542 Cervicalgia: Secondary | ICD-10-CM

## 2024-05-08 DIAGNOSIS — R278 Other lack of coordination: Secondary | ICD-10-CM

## 2024-05-08 DIAGNOSIS — R262 Difficulty in walking, not elsewhere classified: Secondary | ICD-10-CM

## 2024-05-08 NOTE — Therapy (Signed)
 " OUTPATIENT PHYSICAL THERAPY NEURO and CERVICAL  TREATMENT   Patient Name: Julie Shelton MRN: [id number] DOB:1941-01-23, 84 y.o., female Today's Date: 05/08/2024   PCP: Dr. Trula Shelton REFERRING PROVIDER: Dr. Jannett Shelton   END OF SESSION:  PT End of Session - 05/08/24 1408     Visit Number 3    Number of Visits 24    Date for Recertification  07/18/24    Progress Note Due on Visit 10    PT Start Time 1402    PT Stop Time 1444    PT Time Calculation (min) 42 min    Equipment Utilized During Treatment Gait belt    Activity Tolerance Patient tolerated treatment well    Behavior During Therapy WFL for tasks assessed/performed          Past Medical History:  Diagnosis Date   Allergic rhinitis    Chronic pain    Depression    GERD (gastroesophageal reflux disease)    Hypothyroidism    Torticollis    Vitamin D deficiency    Past Surgical History:  Procedure Laterality Date   AUGMENTATION MAMMAPLASTY     Removed 5 years ago    BREAST ENHANCEMENT SURGERY     breast implants removed     LAPAROSCOPIC TUBAL LIGATION     NECK SURGERY     SHOULDER OPEN ROTATOR CUFF REPAIR     2003   Patient Active Problem List   Diagnosis Date Noted   Gait abnormality 01/13/2023   Left leg paresthesias 09/08/2020   Sensorineural hearing loss (SNHL) of left ear 11/12/2019   Dizziness after extension of neck 11/12/2019   Exertional dyspnea 06/17/2018   Palpitations 06/17/2018   Other fatigue 06/17/2018   Hearing loss 05/24/2018   Cervical dystonia 04/24/2014   Neck pain 05/31/2013   Torticollis    Depression    GERD (gastroesophageal reflux disease)    Hypothyroidism    Allergic rhinitis    Chronic pain    Vitamin D deficiency     ONSET DATE: cervical issues for past 20 years, balance for a couple of years  REFERRING DIAG:  R26.89 (ICD-10-CM) - Imbalance  G24.3 (ICD-10-CM) - Cervical dystonia    THERAPY DIAG:  Unsteadiness on feet  Muscle weakness  (generalized)  Difficulty in walking, not elsewhere classified  Abnormality of gait and mobility  Other lack of coordination  Cervicalgia  Rationale for Evaluation and Treatment: Rehabilitation  SUBJECTIVE:  SUBJECTIVE STATEMENT:  FROM today: Patient reports neck feeling very tight - left side- affects her entire left side from her face to her balance.    From EVAL: Patient reports long standing history of cervical pain and tightness with difficulty turning head which she states has affected her balance. She reports unsteady overall and frequent episodes of LOB. Reports has been seen by PT in past - like 2 years ago with no significant improvement in neck pain. Reports enjoys getting outdoors and would like to be able to do more yard work but her balance makes her fearful and neck pain restricts many activities and hobbies.   Pt accompanied by: self  PERTINENT HISTORY:  Per medical office visit from Julie Shelton: Chronic cervical dystonia with symptoms since 2004, formally diagnosed in 2008. Symptoms include head pulling to the left, difficulty turning the neck, shooting pain from the left cervical region to the left shoulder, and numbness in the left fifth finger. Previous treatments with Botox , Xeomin, and Dysport  injections, as well as clonazepam  and baclofen, have been ineffective. The condition is significantly bothersome, causing pain and stiffness, impacting daily activities. - Discontinue carbidopa levodopa XR by reducing to one pill per day for a week, then stop. - Order Botox  for cervical dystonia (with Dr. Jannett Shelton)  - Referred to [facility] for further evaluation and management. - Referred to [facility] for physical  therapy to assist with balance and mobility issues.  2. Peripheral neuropathy Peripheral neuropathy with symptoms of tingling, numbness, and burning, present for approximately one year. No significant changes in sharp/dull sensation or temperature.    PAIN:  Are you having pain? Yes: NPRS scale:   Pain location: Left side neck/upper back  Pain description: Nauseating, stiff (hallow feeling left side of face)  Aggravating factors: moving my head, talking Relieving factors: rest, tylenol (doesn't help much),   PRECAUTIONS: Fall  RED FLAGS: None   WEIGHT BEARING RESTRICTIONS: No  FALLS: Has patient fallen in last 6 months? No- but states several near falls and constant imbalance.   LIVING ENVIRONMENT: Lives with: lives with their spouse Lives in: House/apartment Stairs: Yes: External: 2 steps; on left going up Has following equipment at home: Single point cane, Walker - 2 wheeled, Environmental Consultant - 4 wheeled, and shower chair  PLOF: Independent  PATIENT GOALS: Get outside and work in yard without losing my balance and falling. Move my head better for improved driving  OBJECTIVE:  Note: Objective measures were completed at Evaluation unless otherwise noted.  DIAGNOSTIC FINDINGS: STUDY DATE: 02/10/2023 PATIENT NAME: Julie Shelton DOB: Apr 03, 1941 MRN: [id number]   EXAM: MRI of the cervical spine   ORDERING CLINICIAN: Modena Callander MD, PhD CLINICAL HISTORY: 84 year old woman with cervicalgia COMPARISON FILMS: 06/13/2013   TECHNIQUE: MRI of the cervical spine was obtained utilizing 3 mm sagittal slices from the posterior fossa down to the T3-4 level with T1, T2 and inversion recovery views. In addition 4 mm axial slices from C2-3 down to T1-2 level were included with T2 and gradient echo views. CONTRAST: None IMAGING SITE: [facility] [place] [facility], [place]    FINDINGS: :  On sagittal images, the spine is imaged from above the cervicomedullary junction to T2.  The visible brain and  the cervicomedullary junction appears normal.  Paravertebral soft tissue appears normal.   The spinal cord has a normal caliber and signal.  There is ACDF at C5-C7, unchanged compared to the 2015 MRI.  The  vertebral bodies are normally aligned.   The vertebral bodies have normal signal.     The discs and interspaces were further evaluated on axial views from C2 to T1 as follows:   C2-C3: There is mild left facet hypertrophy.  The level is otherwise normal and there is no spinal stenosis or nerve root compression.   C3-C4: There is mild right facet hypertrophy with arthrodesis.  Mild right uncovertebral spurring is also noted.  These combined to cause moderate right foraminal narrowing though there is no spinal stenosis or nerve root compression.   C4-C5: There is facet hypertrophy and minimal uncovertebral spurring causing minimal foraminal narrowing but no spinal stenosis or nerve root compression.   C5-C6: There has been ACDF.  There is no spinal stenosis or nerve root compression.   C6-C7: There has been ACDF.  There is some uncovertebral spurring to the right causing mild right foraminal narrowing.  No spinal stenosis or nerve root compression.   C7-T1: This level appears normal.   T1-T2: This level appears normal.   Compared to the MRI from 06/13/2013, the degenerative changes at C3-C4 have progressed.  Other levels are unchanged.   IMPRESSION: This MRI of the cervical spine without contrast shows the following: The spinal cord appears normal. At C3-C4, there is right facet hypertrophy with arthrodesis, that has occurred since the 2015 MRI.SABRA  There is moderate right foraminal narrowing but no spinal stenosis or nerve root compression.  The degenerative changes have progressed compared to the 2015 MRI. At C4-C5, there are mild stable degenerative changes but no spinal stenosis or nerve root compression. ACDF from C5-C7, unchanged compared to the 2015 MRI     INTERPRETING PHYSICIAN:   Richard A. Vear, MD, PhD, FAAN Certified in  Neuroimaging by [employer]  COGNITION: Overall cognitive status: Within functional limits for tasks assessed   SENSATION: Reports some numbness, tingling, and burning sensation in B Feet  COORDINATION: Impaired with walking  EDEMA:  None observed   POSTURE: rounded shoulders, forward head, and Contracture of left neck musculature with head tilting more to left side with restriction with R cervical rotation  LOWER EXTREMITY ROM:     Active  Right Eval Left Eval  Hip flexion    Hip extension    Hip abduction    Hip adduction    Hip internal rotation    Hip external rotation    Knee flexion    Knee extension    Ankle dorsiflexion    Ankle plantarflexion    Ankle inversion    Ankle eversion     (Blank rows = not tested)  LOWER EXTREMITY MMT:    MMT Right Eval Left Eval  Hip flexion 4 4  Hip extension 4 4  Hip abduction 4 4  Hip adduction 4+ 4+  Hip internal rotation 4 4  Hip external rotation 4 4  Knee flexion 4 4  Knee extension 4 4  Ankle dorsiflexion 4 4  Ankle plantarflexion    Ankle inversion    Ankle eversion    (Blank rows = not tested)  AROM R/L 10* Cervical Flexion 15* Cervical Extension 12*/8* Cervical Lateral Flexion 18*/20* Cervical Rotation *Indicates pain, overpressure performed unless otherwise indicated  BED MOBILITY:  Not tested  TRANSFERS: Sit to stand: Complete Independence  Assistive device utilized: None     Stand to sit: Complete Independence  Assistive device utilized: None     Chair to chair: Complete Independence  Assistive device utilized: None  RAMP:  Not tested  CURB:  Not tested  STAIRS: Not tested GAIT: Findings: Gait Characteristics: decreased arm swing- Right, decreased arm swing- Left, decreased step length- Right, decreased step length- Left, and decreased stride length, Distance walked: approx 120 feet, Assistive device  utilized:None, Level of assistance: CGA, and Comments:    FUNCTIONAL TESTS:  5 times sit to stand: To be assessed next visit 6 minute walk test: to be assessed next visit TUG: To be assessed next visit 10 meter walk test: To be assessed next visit Berg Balance Scale:     PATIENT SURVEYS:  ABC- To be issued next visit NDI- To be issued next visit    NECK DISABILITY INDEX  Date: 05/02/2024 Score  Pain intensity 4 = The pain is very severe at the moment  2. Personal care (washing, dressing, etc.) 1 =  I can look after myself normally but it causes extra pain  3. Lifting 1 =  I can lift heavy weights but it gives extra pain  4. Reading 4 =  I can hardly read at all because of severe pain in my neck  5. Headaches 3 = I have moderate headaches, which come frequently  6. Concentration 3 = I have a lot of difficulty in concentrating when I want to  7. Work 2 = I can do most of my usual work, but no more  8. Driving 3 = I can't drive my car as long as I want because of moderate pain in my neck  9. Sleeping 1 = My sleep is slightly disturbed (less than 1 hr sleepless)  10. Recreation 3 = I am able to engage in a few of my usual recreation activities because of pain in   my neck  Total 25/50 or 50%   Minimum Detectable Change (90% confidence): 5 points or 10% points   The Activities-Specific Balance Confidence (ABC) Scale 0% 10 20 30  40 50 60 70 80 90 100% No confidence<->completely confident  How confident are you that you will not lose your balance or become unsteady when you . . .   Date tested 05/02/24  Walk around the house 80%  2. Walk up or down stairs 80%  3. Bend over and pick up a slipper from in front of a closet floor 100%  4. Reach for a small can off a shelf at eye level 100%  5. Stand on tip toes and reach for something above your head 80%  6. Stand on a chair and reach for something 10%  7. Sweep the floor 90%  8. Walk outside the house to a car parked in the driveway  90%  9. Get into or out of a car 90%  10. Walk across a parking lot to the mall 70%  11. Walk up or down a ramp 90%  12. Walk in a crowded mall where people rapidly walk past you 90%  13. Are bumped into by people as you walk through the mall 70%  14. Step onto or off of an escalator while you are holding onto the railing 70%  15. Step onto or off an escalator while holding onto parcels such that you cannot hold onto the railing 10%  16. Walk outside on icy sidewalks 10%  Total: #/16 70.6%     6 Min Walk Test:  Instructed patient to ambulate as quickly and as safely as possible for 6 minutes using LRAD. Patient was allowed to take standing rest breaks without stopping the  test, but if the patient required a sitting rest break the clock would be stopped and the test would be over.  Results: 1085 feet (331 meters, Avg speed 0.92 m/s) using no AD with SBA. Results indicate that the patient has reduced endurance with ambulation compared to age matched norms.  Age Matched Norms: 72-69 yo M: 33 F: 4, 68-79 yo M: 3 F: 471, 29-89 yo M: 417 F: 392 MDC: 58.21 meters (190.98 feet) or 50 meters (ANPTA Core Set of Outcome Measures for Adults with Neurologic Conditions, 2018) m                                                                                                                               TREATMENT DATE: 05/08/2024  Manual Therapy:  -Suboccipital Release x 60 sec hold x 4 -Extensive STM to Cervical Paraspinals, B levator scapulae, B Upper trap region- 20 min with multiple trigger points noted. Patient reports feeling tight yet some improvement after manual techniques.   Therex: -Instructed in cervical AROM activities and added to HEP (3 x 30 sec hold each) - See HEP section for details  Self care/Home management- Instructed patient in medbridge and how to download the app.    PATIENT EDUCATION: Education details: HEP- exercise technique Person educated: Patient Education  method: Explanation Education comprehension: verbalized understanding  HOME EXERCISE PROGRAM: Access Code: [email or web address] URL: [email or web address] Date: 05/08/2024 Prepared by: Reyes London  Exercises - Seated Upper Trapezius Stretch  - 2 x daily - 3 sets - 30-45 sec hold - Seated Levator Scapulae Stretch  - 2 x daily - 3 sets - 30-45 sec hold - Seated Assisted Cervical Rotation with Towel  - 2 x daily - 3 sets - 30-45 sec hold - Cervical Extension AROM with Strap  - 2 x daily - 3 sets - 30-45 hold  GOALS: Goals reviewed with patient? Yes  SHORT TERM GOALS: Target date: 06/06/2024  Pt will be independent with HEP in order to improve strength and balance in order to decrease fall risk and improve function at home and work. Baseline: EVAL- No formal HEP in Place Goal status: INITIAL   LONG TERM GOALS: Target date: 07/18/2024  1. Pt will demonstrate decrease in NDI by at least 19% in order to demonstrate clinically significant reduction in disability related to neck injury/pain   Baseline: 50%  Goal status: Initial  2. The patient will increase cervical active range of motion by at least 15 in rotation bilaterally and 10 in extension, enabling her to turn her head to check blind spots while driving and perform grooming tasks without pain.  Baseline: See AROM cervical chart  Goal status: INITIAL  3. Pt will improve BERG by at least 3 points in order to demonstrate clinically significant improvement in balance.    Baseline: 50/56  Goal status: INITIAL  4. Pt will increase by at least 68m (168ft) in order to demonstrate clinically significant improvement in  cardiopulmonary endurance and community ambulation     Baseline: 1085 feet w/o AD    Goal status: INITIAL   5.  Pt will improve ABC by at least 13% in order to demonstrate clinically significant improvement in balance confidence.   Baseline: 70.6 %  Goal status: INITIAL  ASSESSMENT:  CLINICAL  IMPRESSION: Patient presents with increased cervical soft tissue restrictions and muscular hypertonicity involving the cervical paraspinals, bilateral upper trapezius, and levator scapulae, with multiple trigger points palpated. Findings are contributing to decreased cervical mobility and reported tightness. Manual therapy consisting of suboccipital release and extensive soft tissue mobilization was performed to address soft tissue restrictions and pain modulation. Patient reported mild subjective improvement following manual techniques, though residual tightness remains, indicating ongoing need for skilled intervention. Cervical active range of motion exercises were initiated and instructed with good tolerance. Patient demonstrated appropriate understanding and performance of exercises and was progressed to an independent home exercise program to promote cervical mobility and soft tissue flexibility between visits. Continued skilled physical therapy is indicated to reduce myofascial restrictions, improve cervical mobility, and restore functional tolerance for daily activities.  OBJECTIVE IMPAIRMENTS: Abnormal gait, decreased activity tolerance, decreased balance, decreased coordination, decreased endurance, difficulty walking, decreased ROM, decreased strength, hypomobility, increased fascial restrictions, increased muscle spasms, impaired flexibility, impaired sensation, postural dysfunction, and pain.   ACTIVITY LIMITATIONS: carrying, lifting, bending, sitting, standing, squatting, sleeping, stairs, and transfers  PARTICIPATION LIMITATIONS: meal prep, cleaning, laundry, driving, shopping, community activity, and yard work  PERSONAL FACTORS: Age, Fitness, Past/current experiences, and 1 comorbidity: depression are also affecting patient's functional outcome.   REHAB POTENTIAL: Good  CLINICAL DECISION MAKING: Stable/uncomplicated  EVALUATION COMPLEXITY: Low  PLAN:  PT FREQUENCY: 1-2x/week  PT  DURATION: 12 weeks  PLANNED INTERVENTIONS: [id number]- PT Re-evaluation, 97750- Physical Performance Testing, 97110-Therapeutic exercises, 97530- Therapeutic activity, [id number]- Neuromuscular re-education, 97535- Self Care, [id number]- Manual therapy, [id number]- Gait training, [id number]- Orthotic Initial, [id number]- Orthotic/Prosthetic subsequent, [phone]- Canalith repositioning, [id number]- Electrical stimulation (manual), [phone] (1-2 muscles), 20561 (3+ muscles)- Dry Needling, Patient/Family education, Balance training, Stair training, Taping, Joint mobilization, Joint manipulation, Spinal mobilization, Vestibular training, DME instructions, Cryotherapy, and Moist heat  PLAN FOR NEXT SESSION:   FGA assessment- revise goal if needed.  Review HEP for Balance and cervical needs next 1-2 visit    Reyes LOISE London, PT 05/08/2024, 11:35 PM        "

## 2024-05-10 ENCOUNTER — Ambulatory Visit

## 2024-05-14 ENCOUNTER — Ambulatory Visit

## 2024-05-16 ENCOUNTER — Ambulatory Visit

## 2024-05-21 ENCOUNTER — Ambulatory Visit

## 2024-05-23 ENCOUNTER — Ambulatory Visit

## 2024-05-23 DIAGNOSIS — R269 Unspecified abnormalities of gait and mobility: Secondary | ICD-10-CM

## 2024-05-23 DIAGNOSIS — R278 Other lack of coordination: Secondary | ICD-10-CM

## 2024-05-23 DIAGNOSIS — R262 Difficulty in walking, not elsewhere classified: Secondary | ICD-10-CM

## 2024-05-23 DIAGNOSIS — R2681 Unsteadiness on feet: Secondary | ICD-10-CM

## 2024-05-23 DIAGNOSIS — M542 Cervicalgia: Secondary | ICD-10-CM

## 2024-05-23 DIAGNOSIS — M6281 Muscle weakness (generalized): Secondary | ICD-10-CM

## 2024-05-23 NOTE — Therapy (Signed)
 " OUTPATIENT PHYSICAL THERAPY NEURO and CERVICAL  TREATMENT   Patient Name: Julie Shelton MRN: [id number] DOB:1940-11-20, 84 y.o., female Today's Date: 05/23/2024   PCP: Dr. Trula Brim REFERRING PROVIDER: Dr. Jannett Fairly   END OF SESSION:  PT End of Session - 05/23/24 1544     Visit Number 4    Number of Visits 24    Date for Recertification  07/18/24    Progress Note Due on Visit 10    PT Start Time 1540    PT Stop Time 1620    PT Time Calculation (min) 40 min    Equipment Utilized During Treatment Gait belt    Activity Tolerance Patient tolerated treatment well    Behavior During Therapy WFL for tasks assessed/performed           Past Medical History:  Diagnosis Date   Allergic rhinitis    Chronic pain    Depression    GERD (gastroesophageal reflux disease)    Hypothyroidism    Torticollis    Vitamin D deficiency    Past Surgical History:  Procedure Laterality Date   AUGMENTATION MAMMAPLASTY     Removed 5 years ago    BREAST ENHANCEMENT SURGERY     breast implants removed     LAPAROSCOPIC TUBAL LIGATION     NECK SURGERY     SHOULDER OPEN ROTATOR CUFF REPAIR     2003   Patient Active Problem List   Diagnosis Date Noted   Gait abnormality 01/13/2023   Left leg paresthesias 09/08/2020   Sensorineural hearing loss (SNHL) of left ear 11/12/2019   Dizziness after extension of neck 11/12/2019   Exertional dyspnea 06/17/2018   Palpitations 06/17/2018   Other fatigue 06/17/2018   Hearing loss 05/24/2018   Cervical dystonia 04/24/2014   Neck pain 05/31/2013   Torticollis    Depression    GERD (gastroesophageal reflux disease)    Hypothyroidism    Allergic rhinitis    Chronic pain    Vitamin D deficiency     ONSET DATE: cervical issues for past 20 years, balance for a couple of years  REFERRING DIAG:  R26.89 (ICD-10-CM) - Imbalance  G24.3 (ICD-10-CM) - Cervical dystonia    THERAPY DIAG:  Unsteadiness on feet  Muscle weakness  (generalized)  Difficulty in walking, not elsewhere classified  Abnormality of gait and mobility  Other lack of coordination  Cervicalgia  Rationale for Evaluation and Treatment: Rehabilitation  SUBJECTIVE:  SUBJECTIVE STATEMENT:  FROM today: Patient reports neck feeling very tight - left side- affects her entire left side from her face to her balance.    From EVAL: Patient reports long standing history of cervical pain and tightness with difficulty turning head which she states has affected her balance. She reports unsteady overall and frequent episodes of LOB. Reports has been seen by PT in past - like 2 years ago with no significant improvement in neck pain. Reports enjoys getting outdoors and would like to be able to do more yard work but her balance makes her fearful and neck pain restricts many activities and hobbies.   Pt accompanied by: self  PERTINENT HISTORY:  Per medical office visit from Allyson Stallion, NP: Chronic cervical dystonia with symptoms since 2004, formally diagnosed in 2008. Symptoms include head pulling to the left, difficulty turning the neck, shooting pain from the left cervical region to the left shoulder, and numbness in the left fifth finger. Previous treatments with Botox , Xeomin, and Dysport  injections, as well as clonazepam  and baclofen, have been ineffective. The condition is significantly bothersome, causing pain and stiffness, impacting daily activities. - Discontinue carbidopa levodopa XR by reducing to one pill per day for a week, then stop. - Order Botox  for cervical dystonia (with Dr. Jannett Fairly)  - Referred to [facility] for further evaluation and management. - Referred to [facility] for physical  therapy to assist with balance and mobility issues.  2. Peripheral neuropathy Peripheral neuropathy with symptoms of tingling, numbness, and burning, present for approximately one year. No significant changes in sharp/dull sensation or temperature.    PAIN:  Are you having pain? Yes: NPRS scale:   Pain location: Left side neck/upper back  Pain description: Nauseating, stiff (hallow feeling left side of face)  Aggravating factors: moving my head, talking Relieving factors: rest, tylenol (doesn't help much),   PRECAUTIONS: Fall  RED FLAGS: None   WEIGHT BEARING RESTRICTIONS: No  FALLS: Has patient fallen in last 6 months? No- but states several near falls and constant imbalance.   LIVING ENVIRONMENT: Lives with: lives with their spouse Lives in: House/apartment Stairs: Yes: External: 2 steps; on left going up Has following equipment at home: Single point cane, Walker - 2 wheeled, Environmental Consultant - 4 wheeled, and shower chair  PLOF: Independent  PATIENT GOALS: Get outside and work in yard without losing my balance and falling. Move my head better for improved driving  OBJECTIVE:  Note: Objective measures were completed at Evaluation unless otherwise noted.  DIAGNOSTIC FINDINGS: STUDY DATE: 02/10/2023 PATIENT NAME: Julie Shelton DOB: 1941-01-30 MRN: [id number]   EXAM: MRI of the cervical spine   ORDERING CLINICIAN: Modena Callander MD, PhD CLINICAL HISTORY: 84 year old woman with cervicalgia COMPARISON FILMS: 06/13/2013   TECHNIQUE: MRI of the cervical spine was obtained utilizing 3 mm sagittal slices from the posterior fossa down to the T3-4 level with T1, T2 and inversion recovery views. In addition 4 mm axial slices from C2-3 down to T1-2 level were included with T2 and gradient echo views. CONTRAST: None IMAGING SITE: [facility], [place]    FINDINGS: :  On sagittal images, the spine is imaged from above the cervicomedullary junction to T2.  The visible brain and  the cervicomedullary junction appears normal.  Paravertebral soft tissue appears normal.   The spinal cord has a normal caliber and signal.  There is ACDF at C5-C7, unchanged compared to the 2015 MRI.  The  vertebral bodies are normally aligned.   The vertebral bodies have normal signal.     The discs and interspaces were further evaluated on axial views from C2 to T1 as follows:   C2-C3: There is mild left facet hypertrophy.  The level is otherwise normal and there is no spinal stenosis or nerve root compression.   C3-C4: There is mild right facet hypertrophy with arthrodesis.  Mild right uncovertebral spurring is also noted.  These combined to cause moderate right foraminal narrowing though there is no spinal stenosis or nerve root compression.   C4-C5: There is facet hypertrophy and minimal uncovertebral spurring causing minimal foraminal narrowing but no spinal stenosis or nerve root compression.   C5-C6: There has been ACDF.  There is no spinal stenosis or nerve root compression.   C6-C7: There has been ACDF.  There is some uncovertebral spurring to the right causing mild right foraminal narrowing.  No spinal stenosis or nerve root compression.   C7-T1: This level appears normal.   T1-T2: This level appears normal.   Compared to the MRI from 06/13/2013, the degenerative changes at C3-C4 have progressed.  Other levels are unchanged.   IMPRESSION: This MRI of the cervical spine without contrast shows the following: The spinal cord appears normal. At C3-C4, there is right facet hypertrophy with arthrodesis, that has occurred since the 2015 MRI.SABRA  There is moderate right foraminal narrowing but no spinal stenosis or nerve root compression.  The degenerative changes have progressed compared to the 2015 MRI. At C4-C5, there are mild stable degenerative changes but no spinal stenosis or nerve root compression. ACDF from C5-C7, unchanged compared to the 2015 MRI     INTERPRETING PHYSICIAN:   Richard A. Vear, MD, PhD, FAAN Certified in  Neuroimaging by [employer]  COGNITION: Overall cognitive status: Within functional limits for tasks assessed   SENSATION: Reports some numbness, tingling, and burning sensation in B Feet  COORDINATION: Impaired with walking  EDEMA:  None observed   POSTURE: rounded shoulders, forward head, and Contracture of left neck musculature with head tilting more to left side with restriction with R cervical rotation  LOWER EXTREMITY ROM:     Active  Right Eval Left Eval  Hip flexion    Hip extension    Hip abduction    Hip adduction    Hip internal rotation    Hip external rotation    Knee flexion    Knee extension    Ankle dorsiflexion    Ankle plantarflexion    Ankle inversion    Ankle eversion     (Blank rows = not tested)  LOWER EXTREMITY MMT:    MMT Right Eval Left Eval  Hip flexion 4 4  Hip extension 4 4  Hip abduction 4 4  Hip adduction 4+ 4+  Hip internal rotation 4 4  Hip external rotation 4 4  Knee flexion 4 4  Knee extension 4 4  Ankle dorsiflexion 4 4  Ankle plantarflexion    Ankle inversion    Ankle eversion    (Blank rows = not tested)  AROM R/L 10* Cervical Flexion 15* Cervical Extension 12*/8* Cervical Lateral Flexion 18*/20* Cervical Rotation *Indicates pain, overpressure performed unless otherwise indicated  BED MOBILITY:  Not tested  TRANSFERS: Sit to stand: Complete Independence  Assistive device utilized: None     Stand to sit: Complete Independence  Assistive device utilized: None     Chair to chair: Complete Independence  Assistive device utilized: None  RAMP:  Not tested  CURB:  Not tested  STAIRS: Not tested GAIT: Findings: Gait Characteristics: decreased arm swing- Right, decreased arm swing- Left, decreased step length- Right, decreased step length- Left, and decreased stride length, Distance walked: approx 120 feet, Assistive device  utilized:None, Level of assistance: CGA, and Comments:    FUNCTIONAL TESTS:  5 times sit to stand: To be assessed next visit 6 minute walk test: to be assessed next visit TUG: To be assessed next visit 10 meter walk test: To be assessed next visit Berg Balance Scale:     PATIENT SURVEYS:  ABC- To be issued next visit NDI- To be issued next visit    NECK DISABILITY INDEX  Date: 05/02/2024 Score  Pain intensity 4 = The pain is very severe at the moment  2. Personal care (washing, dressing, etc.) 1 =  I can look after myself normally but it causes extra pain  3. Lifting 1 =  I can lift heavy weights but it gives extra pain  4. Reading 4 =  I can hardly read at all because of severe pain in my neck  5. Headaches 3 = I have moderate headaches, which come frequently  6. Concentration 3 = I have a lot of difficulty in concentrating when I want to  7. Work 2 = I can do most of my usual work, but no more  8. Driving 3 = I can't drive my car as long as I want because of moderate pain in my neck  9. Sleeping 1 = My sleep is slightly disturbed (less than 1 hr sleepless)  10. Recreation 3 = I am able to engage in a few of my usual recreation activities because of pain in   my neck  Total 25/50 or 50%   Minimum Detectable Change (90% confidence): 5 points or 10% points   The Activities-Specific Balance Confidence (ABC) Scale 0% 10 20 30  40 50 60 70 80 90 100% No confidence<->completely confident  How confident are you that you will not lose your balance or become unsteady when you . . .   Date tested 05/02/24  Walk around the house 80%  2. Walk up or down stairs 80%  3. Bend over and pick up a slipper from in front of a closet floor 100%  4. Reach for a small can off a shelf at eye level 100%  5. Stand on tip toes and reach for something above your head 80%  6. Stand on a chair and reach for something 10%  7. Sweep the floor 90%  8. Walk outside the house to a car parked in the driveway  90%  9. Get into or out of a car 90%  10. Walk across a parking lot to the mall 70%  11. Walk up or down a ramp 90%  12. Walk in a crowded mall where people rapidly walk past you 90%  13. Are bumped into by people as you walk through the mall 70%  14. Step onto or off of an escalator while you are holding onto the railing 70%  15. Step onto or off an escalator while holding onto parcels such that you cannot hold onto the railing 10%  16. Walk outside on icy sidewalks 10%  Total: #/16 70.6%     6 Min Walk Test:  Instructed patient to ambulate as quickly and as safely as possible for 6 minutes using LRAD. Patient was allowed to take standing rest breaks without stopping the  test, but if the patient required a sitting rest break the clock would be stopped and the test would be over.  Results: 1085 feet (331 meters, Avg speed 0.92 m/s) using no AD with SBA. Results indicate that the patient has reduced endurance with ambulation compared to age matched norms.  Age Matched Norms: 32-69 yo M: 36 F: 86, 86-79 yo M: 60 F: 471, 28-89 yo M: 417 F: 392 MDC: 58.21 meters (190.98 feet) or 50 meters (ANPTA Core Set of Outcome Measures for Adults with Neurologic Conditions, 2018) m                                                                                                                               TREATMENT DATE: 05/08/2024  Manual Therapy:  -Suboccipital Release x 60 sec hold x 4 -Extensive STM to Cervical Paraspinals, L levator scapulae, L Upper trap region- 25 min with multiple trigger points noted. Patient reports continuing to feel better overall and less pain with relief at end of manual session.  NMR:  -seated posterior shoulder roll x 20  -Standing wall posture stretch with pool noodle x 60 sec -Standing w stretch/ROM at wall  x 15 reps   PATIENT EDUCATION: Education details: HEP- exercise technique Person educated: Patient Education method: Explanation Education comprehension:  verbalized understanding  HOME EXERCISE PROGRAM: Access Code: [email or web address] URL: [email or web address] Date: 05/08/2024 Prepared by: Reyes London  Exercises - Seated Upper Trapezius Stretch  - 2 x daily - 3 sets - 30-45 sec hold - Seated Levator Scapulae Stretch  - 2 x daily - 3 sets - 30-45 sec hold - Seated Assisted Cervical Rotation with Towel  - 2 x daily - 3 sets - 30-45 sec hold - Cervical Extension AROM with Strap  - 2 x daily - 3 sets - 30-45 hold  GOALS: Goals reviewed with patient? Yes  SHORT TERM GOALS: Target date: 06/06/2024  Pt will be independent with HEP in order to improve strength and balance in order to decrease fall risk and improve function at home and work. Baseline: EVAL- No formal HEP in Place Goal status: INITIAL   LONG TERM GOALS: Target date: 07/18/2024  1. Pt will demonstrate decrease in NDI by at least 19% in order to demonstrate clinically significant reduction in disability related to neck injury/pain   Baseline: 50%  Goal status: Initial  2. The patient will increase cervical active range of motion by at least 15 in rotation bilaterally and 10 in extension, enabling her to turn her head to check blind spots while driving and perform grooming tasks without pain.  Baseline: See AROM cervical chart  Goal status: INITIAL  3. Pt will improve BERG by at least 3 points in order to demonstrate clinically significant improvement in balance.    Baseline: 50/56  Goal status: INITIAL  4. Pt will increase by at least 28m (144ft) in order to demonstrate clinically significant improvement in cardiopulmonary endurance and  community ambulation     Baseline: 1085 feet w/o AD    Goal status: INITIAL   5.  Pt will improve ABC by at least 13% in order to demonstrate clinically significant improvement in balance confidence.   Baseline: 70.6 %  Goal status: INITIAL  ASSESSMENT:  CLINICAL IMPRESSION:  The patient presents with cervical pain  accompanied by increased soft tissue tightness along the left neck and upper back. She demonstrates notable myofascial restrictions and multiple trigger points in the left levator scapulae and upper trapezius, contributing to postural dysfunction and reduced cervical mobility. She responded positively to manual therapy with decreased pain and improved tissue pliability following suboccipital release and extensive soft tissue mobilization. Postural NMR activities were performed with good tolerance, and the patient showed improved awareness and activation of posterior shoulder and scapular stabilizers. Overall, she is progressing with reduced discomfort and improved functional mobility following todays session.  Continued skilled physical therapy is indicated to reduce myofascial restrictions, improve cervical mobility, and restore functional tolerance for daily activities.  OBJECTIVE IMPAIRMENTS: Abnormal gait, decreased activity tolerance, decreased balance, decreased coordination, decreased endurance, difficulty walking, decreased ROM, decreased strength, hypomobility, increased fascial restrictions, increased muscle spasms, impaired flexibility, impaired sensation, postural dysfunction, and pain.   ACTIVITY LIMITATIONS: carrying, lifting, bending, sitting, standing, squatting, sleeping, stairs, and transfers  PARTICIPATION LIMITATIONS: meal prep, cleaning, laundry, driving, shopping, community activity, and yard work  PERSONAL FACTORS: Age, Fitness, Past/current experiences, and 1 comorbidity: depression are also affecting patient's functional outcome.   REHAB POTENTIAL: Good  CLINICAL DECISION MAKING: Stable/uncomplicated  EVALUATION COMPLEXITY: Low  PLAN:  PT FREQUENCY: 1-2x/week  PT DURATION: 12 weeks  PLANNED INTERVENTIONS: [id number]- PT Re-evaluation, 97750- Physical Performance Testing, 97110-Therapeutic exercises, 97530- Therapeutic activity, [id number]- Neuromuscular re-education, 97535- Self  Care, [id number]- Manual therapy, [id number]- Gait training, [id number]- Orthotic Initial, [phone]- Orthotic/Prosthetic subsequent, [phone]- Canalith repositioning, [id number]- Electrical stimulation (manual), [phone] (1-2 muscles), [id number] (3+ muscles)- Dry Needling, Patient/Family education, Balance training, Stair training, Taping, Joint mobilization, Joint manipulation, Spinal mobilization, Vestibular training, DME instructions, Cryotherapy, and Moist heat  PLAN FOR NEXT SESSION:  Manual therapy for cervical ROM Postural education and training  FGA assessment- revise goal if needed.     Reyes LOISE London, PT 05/23/2024, 5:30 PM        "

## 2024-05-28 ENCOUNTER — Ambulatory Visit

## 2024-05-30 ENCOUNTER — Ambulatory Visit

## 2024-06-04 ENCOUNTER — Ambulatory Visit

## 2024-06-06 ENCOUNTER — Ambulatory Visit

## 2024-06-11 ENCOUNTER — Ambulatory Visit

## 2024-06-13 ENCOUNTER — Ambulatory Visit

## 2024-06-19 ENCOUNTER — Ambulatory Visit: Admitting: Physical Therapy

## 2024-06-21 ENCOUNTER — Ambulatory Visit: Admitting: Physical Therapy

## 2024-06-26 ENCOUNTER — Ambulatory Visit

## 2024-06-28 ENCOUNTER — Ambulatory Visit

## 2024-07-03 ENCOUNTER — Ambulatory Visit

## 2024-07-05 ENCOUNTER — Ambulatory Visit

## 2024-07-10 ENCOUNTER — Ambulatory Visit

## 2024-07-12 ENCOUNTER — Ambulatory Visit

## 2024-07-17 ENCOUNTER — Ambulatory Visit

## 2024-07-19 ENCOUNTER — Ambulatory Visit

## 2024-07-24 ENCOUNTER — Ambulatory Visit

## 2024-07-26 ENCOUNTER — Ambulatory Visit

## 2024-07-31 ENCOUNTER — Ambulatory Visit

## 2024-08-02 ENCOUNTER — Ambulatory Visit
